# Patient Record
Sex: Male | Born: 1955 | Race: Asian | Hispanic: No | Marital: Single | Smoking: Never smoker
Health system: Southern US, Community
[De-identification: ages and names within clinical notes are randomized; demographics above are authoritative.]

## PROBLEM LIST (undated history)

## (undated) DIAGNOSIS — G4730 Sleep apnea, unspecified: Secondary | ICD-10-CM

## (undated) DIAGNOSIS — R61 Generalized hyperhidrosis: Secondary | ICD-10-CM

## (undated) DIAGNOSIS — K219 Gastro-esophageal reflux disease without esophagitis: Secondary | ICD-10-CM

## (undated) DIAGNOSIS — M1990 Unspecified osteoarthritis, unspecified site: Secondary | ICD-10-CM

## (undated) DIAGNOSIS — T7840XA Allergy, unspecified, initial encounter: Secondary | ICD-10-CM

## (undated) HISTORY — DX: Generalized hyperhidrosis: R61

## (undated) HISTORY — DX: Sleep apnea, unspecified: G47.30

## (undated) HISTORY — DX: Allergy, unspecified, initial encounter: T78.40XA

## (undated) HISTORY — DX: Gastro-esophageal reflux disease without esophagitis: K21.9

---

## 2011-11-16 ENCOUNTER — Ambulatory Visit: Payer: BC Managed Care – PPO

## 2013-03-15 ENCOUNTER — Ambulatory Visit: Payer: BC Managed Care – PPO | Admitting: Physician Assistant

## 2013-03-15 ENCOUNTER — Ambulatory Visit: Payer: BC Managed Care – PPO

## 2013-03-15 VITALS — BP 110/68 | HR 106 | Temp 103.0°F | Resp 17 | Ht 65.0 in | Wt 122.0 lb

## 2013-03-15 DIAGNOSIS — R6889 Other general symptoms and signs: Secondary | ICD-10-CM

## 2013-03-15 DIAGNOSIS — R059 Cough, unspecified: Secondary | ICD-10-CM

## 2013-03-15 DIAGNOSIS — R509 Fever, unspecified: Secondary | ICD-10-CM

## 2013-03-15 DIAGNOSIS — R05 Cough: Secondary | ICD-10-CM

## 2013-03-15 DIAGNOSIS — Z789 Other specified health status: Secondary | ICD-10-CM

## 2013-03-15 LAB — POCT CBC
Granulocyte percent: 83.5 %G — AB (ref 37–80)
HCT, POC: 45.9 % (ref 43.5–53.7)
Hemoglobin: 14.2 g/dL (ref 14.1–18.1)
LYMPH, POC: 0.9 (ref 0.6–3.4)
MCH: 23.4 pg — AB (ref 27–31.2)
MCHC: 30.9 g/dL — AB (ref 31.8–35.4)
MCV: 75.5 fL — AB (ref 80–97)
MID (CBC): 0.6 (ref 0–0.9)
MPV: 9.3 fL (ref 0–99.8)
PLATELET COUNT, POC: 243 10*3/uL (ref 142–424)
POC Granulocyte: 7.4 — AB (ref 2–6.9)
POC LYMPH %: 9.7 % — AB (ref 10–50)
POC MID %: 6.8 % (ref 0–12)
RBC: 6.08 M/uL (ref 4.69–6.13)
RDW, POC: 15.2 %
WBC: 8.9 10*3/uL (ref 4.6–10.2)

## 2013-03-15 LAB — POCT INFLUENZA A/B
INFLUENZA B, POC: NEGATIVE
Influenza A, POC: NEGATIVE

## 2013-03-15 MED ORDER — AZITHROMYCIN 250 MG PO TABS: ORAL_TABLET | ORAL | Status: AC

## 2013-03-15 MED ORDER — ACETAMINOPHEN 325 MG PO TABS: 1000.0000 mg | ORAL_TABLET | Freq: Four times a day (QID) | ORAL | Status: DC | PRN

## 2013-03-15 MED ORDER — HYDROCOD POLST-CHLORPHEN POLST 10-8 MG/5ML PO LQCR: 5.0000 mL | Freq: Two times a day (BID) | ORAL | Status: AC

## 2013-03-15 MED ORDER — GUAIFENESIN ER 1200 MG PO TB12: 1.0000 | ORAL_TABLET | Freq: Two times a day (BID) | ORAL | Status: AC

## 2013-03-15 NOTE — Progress Notes (Signed)
Subjective:    Patient ID: Victor Munoz, male    DOB: 03/18/1955, 58 y.o.   MRN: [id number]  HPI Pt presents to clinic with 6 days h/o feeling poorly.  Pt has flu-like symptoms and he has not been eating in the last 4 days because he is not hungry and when he eats he gets some epigastric pain that radiates into his chest, like a burning pain.  He has not checked his temperature but he has felt hot and cold since then.  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  There is a language barrier.  OTC meds - none Sick contacts - none Works a Theme park manager Flu vaccine - none  Review of Systems  Constitutional: Positive for fever and chills.  HENT: Positive for congestion, rhinorrhea (borwn) and sore throat.   Respiratory: Positive for cough (black) and shortness of breath.   Gastrointestinal: Positive for abdominal pain (epigastric - describes heartburn symptoms). Negative for nausea, vomiting and diarrhea.  Musculoskeletal: Positive for myalgias.  Neurological: Positive for headaches.       Objective:   Physical Exam  Vitals reviewed. Constitutional: He is oriented to person, place, and time. He appears well-developed and well-nourished.  Non-toxic appearance. He appears ill.  Appears to not feel well.    HENT:  Head: Normocephalic and atraumatic.  Right Ear: Hearing, tympanic membrane, external ear and ear canal normal.  Left Ear: Hearing, tympanic membrane, external ear and ear canal normal.  Nose: Mucosal edema (red) and rhinorrhea (clear) present.  Mouth/Throat: Uvula is midline, oropharynx is clear and moist and mucous membranes are normal.  Eyes: Conjunctivae are normal.  Neck: Normal range of motion.  Cardiovascular: Normal rate, regular rhythm and normal heart sounds.   Pulmonary/Chest: Effort normal and breath sounds normal.  Abdominal: Soft. There is tenderness (mild epigstric - when I palpated this region causes burning in his chest).  Lymphadenopathy:    He has no cervical  adenopathy.  Neurological: He is alert and oriented to person, place, and time.  Skin: Skin is warm and dry.  Psychiatric: He has a normal mood and affect. His behavior is normal. Judgment and thought content normal.     Results for orders placed in visit on 03/15/13  POCT INFLUENZA A/B      Result Value Range   Influenza A, POC Negative     Influenza B, POC Negative    POCT CBC      Result Value Range   WBC 8.9  4.6 - 10.2 K/uL   Lymph, poc 0.9  0.6 - 3.4   POC LYMPH PERCENT 9.7 (*) 10 - 50 %L   MID (cbc) 0.6  0 - 0.9   POC MID % 6.8  0 - 12 %M   POC Granulocyte 7.4 (*) 2 - 6.9   Granulocyte percent 83.5 (*) 37 - 80 %G   RBC 6.08  4.69 - 6.13 M/uL   Hemoglobin 14.2  14.1 - 18.1 g/dL   HCT, POC 45.9  43.5 - 53.7 %   MCV 75.5 (*) 80 - 97 fL   MCH, POC 23.4 (*) 27 - 31.2 pg   MCHC 30.9 (*) 31.8 - 35.4 g/dL   RDW, POC 15.2     Platelet Count, POC 243  142 - 424 K/uL   MPV 9.3  0 - 99.8 fL   [facility] reading (PRIMARY) by  Dr. Everlene Farrier.  Increased interstitial marking RML.  Increase streaking over heart border in the  lateral view.     Assessment & Plan:  Fever, unspecified - Plan: acetaminophen (TYLENOL) tablet 975 mg, POCT CBC  Flu-like symptoms - Plan: POCT Influenza A/B, Guaifenesin (MUCINEX MAXIMUM STRENGTH) 1200 MG TB12  Cough - Plan: DG Chest 2 View, azithromycin (ZITHROMAX Z-PAK) 250 MG tablet, chlorpheniramine-HYDROcodone (TUSSIONEX PENNKINETIC ER) 10-8 MG/5ML LQCR  I suspect that the patient has the flu but due to his dirty work environment and continued high fever will cover him for a post-flu PNA.  He will be out of work for 48h and he will RTC in 48h if he is not improved.  Symptomatic flu care was discussed with patient.  I think that he is having a gastritis due to decrease eating from the flu and as he advances diet as tolerated his stomach will improve - if not he will RTC for possible H pylori testing and reflux treatment.  Windell Hummingbird PA-C 03/16/2013 8:05 AM

## 2013-03-15 NOTE — Patient Instructions (Signed)
Push fluids.  Tylenol/motrin prn. Recheck in 2 days if you are not feeling better.

## 2013-03-16 ENCOUNTER — Encounter: Payer: Self-pay | Admitting: Physician Assistant

## 2013-03-16 DIAGNOSIS — Z789 Other specified health status: Secondary | ICD-10-CM | POA: Insufficient documentation

## 2014-05-08 DIAGNOSIS — R61 Generalized hyperhidrosis: Secondary | ICD-10-CM

## 2014-05-08 HISTORY — DX: Generalized hyperhidrosis: R61

## 2014-10-18 ENCOUNTER — Ambulatory Visit: Payer: Worker's Compensation

## 2014-10-18 ENCOUNTER — Ambulatory Visit
Admission: EM | Admit: 2014-10-18 | Discharge: 2014-10-18 | Disposition: A | Discharge status: Home / Self Care | Payer: Worker's Compensation | Attending: Internal Medicine | Admitting: Internal Medicine

## 2014-10-18 DIAGNOSIS — S60222A Contusion of left hand, initial encounter: Secondary | ICD-10-CM | POA: Diagnosis not present

## 2014-10-18 DIAGNOSIS — S63602A Unspecified sprain of left thumb, initial encounter: Secondary | ICD-10-CM | POA: Diagnosis present

## 2014-10-18 DIAGNOSIS — W208XXA Other cause of strike by thrown, projected or falling object, initial encounter: Secondary | ICD-10-CM | POA: Diagnosis not present

## 2014-10-18 MED ORDER — ACETAMINOPHEN 500 MG PO TABS
500.0000 mg | ORAL_TABLET | Freq: Once | ORAL | Status: AC
  Administered 2014-10-18: 500 mg via ORAL

## 2014-10-18 NOTE — ED Provider Notes (Signed)
CSN: [id number]     Arrival date & time 10/18/14  1526 History   First MD Initiated Contact with Patient 10/18/14 1607     Chief Complaint  Patient presents with  . Hand Injury   (Consider location/radiation/quality/duration/timing/severity/associated sxs/prior Treatment) HPI  59 yo M comes with workmate from [employer]. 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 He is counsekle dan appears to agree. He shares that a large air-conditioning unit was being moved and it slipped, pinning Shiraz's hand under it. He indicates the thumb as most uncomfortable but hand is swollen as well  History reviewed. No pertinent past medical history. History reviewed. No pertinent past surgical history. History reviewed. No pertinent family history. Social History  Substance Use Topics  . Smoking status: Never Smoker   . Smokeless tobacco: None  . Alcohol Use: No    Review of Systems Review of 10 systems negative for acute change except as referenced in HPI  Allergies  Review of patient's allergies indicates no known allergies.  Home Medications   Prior to Admission medications   Not on File   BP 130/97 mmHg  Pulse 90  Temp(Src) 98.4 F (36.9 C) (Tympanic)  Resp 16  Ht 5\' 2"  (1.575 m)  Wt 120 lb (54.432 kg)  BMI 21.94 kg/m2  SpO2 99% Physical Exam   Constitutional - Right handed adult male, darkly tanned c/w outside occupation, alert and oriented,denies much distress but identifies thumb as concern Head-atraumatic, normocephalic Eyes- conjunctiva normal, EOMI ,conjugate gaze Nose- no congestion or rhinorrhea Mouth/throat- mucous membranes moist , Neck- supple  CV- regular rate, grossly normal heart sounds,  Resp-no  distress, normal respiratory effort,clear to auscultation bilaterally GI- ,no distention GU- deferred MSK-  normal ROM, all extremities, ambulatory, self-care; left hand with swollen dorsum. Left thumb appears to be either dislocated or significatlty arthritic at MP;  films deny dislocation, warm , fingers FROM , good cap fill; Can approximate thumb to fingertip index but not to 3.4.Marland Kitchen5.6 initially-much more capable with thumb spica on. Neuro- normal speech and language, no gross focal neurological deficit appreciated, no gait instability, Skin-warm,dry ,intact; no rash noted Psych-mood and affect grossly normal; speech and behavior grossly normal ED Course  Procedures (including critical care time) Labs Review Labs Reviewed - No data to display  Imaging Review Dg Hand Complete Left  10/18/2014   CLINICAL DATA:  Patient complaining of left hand being crush by an air conditioner falling on it. His pain is in the thumb region. He also has some swelling there as well.  EXAM: LEFT HAND - COMPLETE 3+ VIEW  COMPARISON:  None.  FINDINGS: No fracture or dislocation.  Mild changes of osteoarthritis with mild asymmetric joint space narrowing of several DIP joints most evident of the fourth finger DIP joint, and mild spurring from the head of the third metacarpal.  Soft tissues are unremarkable.  IMPRESSION: 1. No fracture or dislocation.  No acute finding.   Electronically Signed   By: Lajean Manes M.D.   On: 10/18/2014 16:28     MDM   1. Left thumb sprain, initial encounter   2. Hand contusion, left, initial encounter    Fitted with Universal thumb Spica and he smiled an nodded, apparently  indicating pleasure with the support wrap.  Taught to apply and adjust for comfort to wear except for showers and sleep. He takes Tylenol comfortably and has used 1000 mg TID in the past successfully. May continue Encourage ice paks, frozen peas, tylenol. Elevation and limited activity strongly  encouraged. Wanted to go back to work and this is refused.  Recommend very light duty tomorrow ,off the weekend, back at job site Monday with less than 5 pound lift.  He wanted to go right back to work and this is discouraged- rest for the weekend and will advance as healing progresses. See in one week    Jan Fireman, PA-C 10/19/14 2347

## 2014-10-18 NOTE — ED Notes (Signed)
Apparently trying to slide a mat under an air conditioning unit around 1515 hours and A/C unit fell on left hand. + swelling. Speaks '[place]'. 10/10 pain. Ice pack applied

## 2014-10-18 NOTE — Discharge Instructions (Signed)
You may use Tylenol 500 mg 2 tablets   Two times a day as needed    Please use ice packs  Please do not make your left hand work- it needs to rest for the next week You may move your fingers to exercise them  We will see you next Thursday after 1 pm  October 25, 2014  Thumb Sprain Your exam shows you have a sprained thumb. This means the ligaments around the joint have been torn. Thumb sprains usually take 3-6 weeks to heal. However, severe, unstable sprains may need to be fixed surgically. Sometimes a small piece of bone is pulled off by the ligament. If this is not treated properly, a sprained thumb can lead to a painful, weak joint. Treatment helps reduce pain and shortens the period of disability. The thumb, and often the wrist, must remain splinted for the first 2-4 weeks to protect the joint. Keep your hand elevated and apply ice packs frequently to the injured area (20-30 minutes every 2-3 hours) for the next 2-4 days. This helps reduce swelling and control pain. Pain medicine may also be used for several days. Motion and strengthening exercises may later be prescribed for the joint to return to normal function. Be sure to see your doctor for follow-up because your thumb joint may require further support with splints, bandages or tape. Please see your doctor or go to the emergency room right away if you have increased pain despite proper treatment, or a numb, cold, or pale thumb. Document Released: 04/02/2004 Document Revised: 05/18/2011 Document Reviewed: 02/25/2008 [facility] Patient Information 2015 [place], [place]. This information is not intended to replace advice given to you by your health care provider. Make sure you discuss any questions you have with your health care provider.

## 2014-10-25 ENCOUNTER — Ambulatory Visit
Admission: EM | Admit: 2014-10-25 | Discharge: 2014-10-25 | Disposition: A | Discharge status: Home / Self Care | Payer: Worker's Compensation | Attending: Family Medicine | Admitting: Family Medicine

## 2014-10-25 DIAGNOSIS — S60222D Contusion of left hand, subsequent encounter: Secondary | ICD-10-CM | POA: Diagnosis not present

## 2014-10-25 HISTORY — DX: Unspecified osteoarthritis, unspecified site: M19.90

## 2014-10-25 MED ORDER — IBUPROFEN 600 MG PO TABS: 600.0000 mg | ORAL_TABLET | Freq: Four times a day (QID) | ORAL | Status: DC | PRN

## 2014-10-25 NOTE — ED Notes (Signed)
Seen here at [facility] 10/18/14 after injury left thumb when an air conditioning unit fell on left hand. Continues wearing spica splint and still "hurts a little". Difficulty moving thumb

## 2014-10-25 NOTE — ED Provider Notes (Signed)
CSN: [id number]     Arrival date & time 10/25/14  0909 History   First MD Initiated Contact with Patient 10/18/14 1607     Chief Complaint  Patient presents with  . Follow-up   HPI  Victor Munoz is a pleasant 59 y.o. male here for followup left hand contusion injury.  [place] speaks broken english & exhibits good understanding of my questions.  A large air-conditioning unit was being moved, slipped, pinning patient's hand under it.  He has been using tylenol for pain which helps some & wearing thumb spica.  He is working with weight restriction modifications.  He has continued mild pain & points to left palm contusion & dorsum of thumb.     Review of Systems Gen: Denies any fever, chills, sweats, anorexia, fatigue, weakness, malaise, weight loss, and sleep disorder CV: Denies chest pain, angina, palpitations, syncope, orthopnea, PND, peripheral edema, and claudication. Resp: Denies dyspnea at rest, dyspnea with exercise, cough, sputum, wheezing, coughing up blood, and pleurisy. GI: Denies vomiting blood, jaundice, and fecal incontinence.   Denies dysphagia or odynophagia. Derm: Denies rash, itching, dry skin, hives, moles, warts, or unhealing ulcers.  Psych: Denies depression, anxiety,or confusion. Heme: + bruising, No bleeding nor enlarged lymph nodes.   Allergies  Review of patient's allergies indicates no known allergies.  Home Medications   Prior to Admission medications   Not on File   BP 130/92 mmHg  Pulse 60  Temp(Src) 97 F (36.1 C) (Tympanic)  Resp 16  Ht 5\' 2"  (1.575 m)  Wt 120 lb (54.432 kg)  BMI 21.94 kg/m2  SpO2 100% Physical Exam General:   Alert,  Well-developed, well-nourished, pleasant and cooperative in NAD Head:  Normocephalic and atraumatic. Eyes:  Sclera clear, no icterus.   Conjunctiva pink. Msk:  Symmetrical without gross deformities. Normal posture. Pulses:  Normal pulses noted. Extremities:  Without clubbing or  cyanosis Neurologic:  Alert and  oriented x4;  grossly normal neurologically. Skin:  Warm, intact +left palmar ecchymosis & mild edema MSK-  normal ROM all extremities. +left hand with swollen dorsum. Left thumb with chronic arthritic changes @MP .Left digits ROM limited secondary to swelling, good cap refill; Cannot approximate thumb to fingertips secondary to swelling.  +sensation intact all digits. Psych:  Alert and cooperative. Normal mood and affect.  ED Course  Procedures none  MDM   1. Hand contusion, left, subsequent encounter    Left hand hematoma persists with mild edema & subsequent decreased ROM.  Patient needs referral to orthopedics/hand specialist.  Underlying chronic arthritic changes.  He should continue light duty restrictions per [employer] form.  Plan: 1. Continue spica splint  2. rx as per orders; risks, benefits, potential side effects reviewed with patient 3. Recommend supportive treatment with ice/elevation 4. Referral to Orthopedist within a week   Andria Meuse, NP 10/25/14 1009

## 2016-03-16 ENCOUNTER — Ambulatory Visit (INDEPENDENT_AMBULATORY_CARE_PROVIDER_SITE_OTHER): Payer: 59 | Admitting: Physician Assistant

## 2016-03-16 VITALS — BP 118/72 | HR 77 | Temp 99.2°F | Resp 18 | Ht 62.0 in | Wt 124.0 lb

## 2016-03-16 DIAGNOSIS — K219 Gastro-esophageal reflux disease without esophagitis: Secondary | ICD-10-CM

## 2016-03-16 DIAGNOSIS — J069 Acute upper respiratory infection, unspecified: Secondary | ICD-10-CM | POA: Diagnosis not present

## 2016-03-16 MED ORDER — HYDROCOD POLST-CPM POLST ER 10-8 MG/5ML PO SUER: 5.0000 mL | Freq: Two times a day (BID) | ORAL | 0 refills | Status: DC | PRN

## 2016-03-16 MED ORDER — RANITIDINE HCL 150 MG PO TABS: 150.0000 mg | ORAL_TABLET | Freq: Two times a day (BID) | ORAL | 0 refills | Status: DC

## 2016-03-16 MED ORDER — BENZONATATE 100 MG PO CAPS: 100.0000 mg | ORAL_CAPSULE | Freq: Three times a day (TID) | ORAL | 0 refills | Status: DC | PRN

## 2016-03-16 MED ORDER — FLUTICASONE PROPIONATE 50 MCG/ACT NA SUSP: 2.0000 | Freq: Every day | NASAL | 0 refills | Status: DC

## 2016-03-16 NOTE — Progress Notes (Signed)
MRN: [id number] DOB: 1956/01/04  Subjective:   Victor Munoz is a 61 y.o. male presenting for chief complaint of Sinusitis .  Reports 4 day history of sinus congestion, rhinorrhea, ear fullness, sore throat and dry cough (no hemoptysis).  Has not tried anything for relief. Denies fever, wheezing, shortness of breath and chest tightness, night sweats, chills, nausea, vomiting, abdominal pain and diarrhea. Has not had any sick contact with anyone. No history of seasonal allergies or asthma. Patient has not had flu shot this season. Denies smoking and alcohol use.   Pt also wants to take about heartburn he has been having for the past month. Notes each morning around 3 am wakes up with this acidic regurgitation in his mouth. Has associated belching. He eats dinner around 7pm and lies down around 9pm. Denies spicy food consumption. Mostly eats rice, vegetables, and meat.   Victor Munoz has a current medication list which includes the following prescription(s): ibuprofen, and the following Facility-Administered Medications: acetaminophen. Also has No Known Allergies.  Victor Munoz  has a past medical history of Arthritis. Also  has no past surgical history on file.   Objective:   Vitals: BP 118/72 (BP Location: Right Arm, Patient Position: Sitting, Cuff Size: Small)   Pulse 77   Temp 99.2 F (37.3 C) (Oral)   Resp 18   Ht 5\' 2"  (1.575 m)   Wt 124 lb (56.2 kg)   SpO2 99%   BMI 22.68 kg/m   Physical Exam  Constitutional: He is oriented to person, place, and time. He appears well-developed and well-nourished. No distress.  HENT:  Head: Normocephalic and atraumatic.  Right Ear: External ear and ear canal normal. Tympanic membrane is erythematous.  Left Ear: External ear and ear canal normal. Tympanic membrane is erythematous.  Nose: Mucosal edema and rhinorrhea present. Right sinus exhibits no maxillary sinus tenderness and no frontal sinus tenderness. Left sinus exhibits no maxillary sinus tenderness and no  frontal sinus tenderness.  Mouth/Throat: Uvula is midline and mucous membranes are normal. Posterior oropharyngeal erythema present. No tonsillar exudate.  Eyes: Conjunctivae are normal.  Neck: Normal range of motion.  Cardiovascular: Normal rate, regular rhythm, normal heart sounds and intact distal pulses.   Pulmonary/Chest: Effort normal and breath sounds normal.  Lymphadenopathy:       Head (right side): No submental, no submandibular, no tonsillar, no preauricular, no posterior auricular and no occipital adenopathy present.       Head (left side): No submental, no submandibular, no tonsillar, no preauricular, no posterior auricular and no occipital adenopathy present.    He has no cervical adenopathy.       Right: No supraclavicular adenopathy present.       Left: No supraclavicular adenopathy present.  Neurological: He is alert and oriented to person, place, and time.  Skin: Skin is warm and dry.  Psychiatric: He has a normal mood and affect.  Vitals reviewed.   No results found for this or any previous visit (from the past 24 hour(s)).  Assessment and Plan :  1. Acute upper respiratory infection Likely viral etiology, will treat symptomatically. Pt instructed to return in 7 days if no improvement, return sooner if symptoms worsen.  - fluticasone (FLONASE) 50 MCG/ACT nasal spray; Place 2 sprays into both nostrils daily.  Dispense: 16 g; Refill: 0 - chlorpheniramine-HYDROcodone (TUSSIONEX PENNKINETIC ER) 10-8 MG/5ML SUER; Take 5 mLs by mouth every 12 (twelve) hours as needed for cough.  Dispense: 100 mL; Refill: 0 - benzonatate (TESSALON) 100  MG capsule; Take 1-2 capsules (100-200 mg total) by mouth 3 (three) times daily as needed for cough.  Dispense: 40 capsule; Refill: 0  2. Gastroesophageal reflux disease, esophagitis presence not specified Given educational material on lifestyle management of GERD. Follow up in 4 weeks for reevaluation and for CPE at this time.  - ranitidine  (ZANTAC) 150 MG tablet; Take 1 tablet (150 mg total) by mouth 2 (two) times daily.  Dispense: 60 tablet; Refill: 0  Tenna Delaine, PA-C  [facility] 03/16/2016 10:48 AM

## 2016-03-16 NOTE — Patient Instructions (Addendum)
- We will treat this as a respiratory viral infection.  - I recommend you rest, drink plenty of fluids, eat light meals including soups.  - Use flonase and OTC 12 hour sudafed for sinus congestion. - You may use cough syrup at night for your cough and sore throat, Tessalon pearls during the day. Be aware that cough syrup can definitely make you drowsy and sleepy so do not drive or operate any heavy machinery if it is affecting you during the day.  - You may also use Tylenol or ibuprofen over-the-counter for your sore throat.  - Please let me know if you are not seeing any improvement or get worse in 7 days.  For acid reflux, start taking zantac twice a day for the next four weeks. Limit foods in your diet that may be worsening these symptoms. Also, make sure you wait at least 3 hours before lying down after eating supper at time. If no improvement with treatment after 4 weeks, follow up as we may need to try a different medication.   It also appears that you have not been seen for an annual physical exam. You should make an appointment with me in 4 weeks so we can see how your GERD is doing and we can do basic lab work to make sure all is good!  Thank you for letting me participate in your health and well being.   Food Choices for Gastroesophageal Reflux Disease, Adult When you have gastroesophageal reflux disease (GERD), the foods you eat and your eating habits are very important. Choosing the right foods can help ease your discomfort. What guidelines do I need to follow?  Choose fruits, vegetables, whole grains, and low-fat dairy products.  Choose low-fat meat, fish, and poultry.  Limit fats such as oils, salad dressings, butter, nuts, and avocado.  Keep a food diary. This helps you identify foods that cause symptoms.  Avoid foods that cause symptoms. These may be different for everyone.  Eat small meals often instead of 3 large meals a day.  Eat your meals slowly, in a place where  you are relaxed.  Limit fried foods.  Cook foods using methods other than frying.  Avoid drinking alcohol.  Avoid drinking large amounts of liquids with your meals.  Avoid bending over or lying down until 2-3 hours after eating. What foods are not recommended? These are some foods and drinks that may make your symptoms worse: Vegetables  Tomatoes. Tomato juice. Tomato and spaghetti sauce. Chili peppers. Onion and garlic. Horseradish. Fruits  Oranges, grapefruit, and lemon (fruit and juice). Meats  High-fat meats, fish, and poultry. This includes hot dogs, ribs, ham, sausage, salami, and bacon. Dairy  Whole milk and chocolate milk. Sour cream. Cream. Butter. Ice cream. Cream cheese. Drinks  Coffee and tea. Bubbly (carbonated) drinks or energy drinks. Condiments  Hot sauce. Barbecue sauce. Sweets/Desserts  Chocolate and cocoa. Donuts. Peppermint and spearmint. Fats and Oils  High-fat foods. This includes [place] fries and potato chips. Other  Vinegar. Strong spices. This includes black pepper, white pepper, red pepper, cayenne, curry powder, cloves, ginger, and chili powder. The items listed above may not be a complete list of foods and drinks to avoid. Contact your dietitian for more information.  This information is not intended to replace advice given to you by your health care provider. Make sure you discuss any questions you have with your health care provider. Document Released: 08/25/2011 Document Revised: 08/01/2015 Document Reviewed: 12/28/2012 Elsevier Interactive Patient Education  2017 [employer] Inc[employer]  

## 2016-04-15 ENCOUNTER — Telehealth: Payer: Self-pay

## 2016-04-15 ENCOUNTER — Encounter: Payer: Self-pay | Admitting: Physician Assistant

## 2016-04-15 ENCOUNTER — Ambulatory Visit (INDEPENDENT_AMBULATORY_CARE_PROVIDER_SITE_OTHER): Payer: 59 | Admitting: Physician Assistant

## 2016-04-15 VITALS — BP 128/78 | HR 69 | Temp 97.8°F | Ht 62.0 in | Wt 124.2 lb

## 2016-04-15 DIAGNOSIS — Z0000 Encounter for general adult medical examination without abnormal findings: Secondary | ICD-10-CM

## 2016-04-15 DIAGNOSIS — Z114 Encounter for screening for human immunodeficiency virus [HIV]: Secondary | ICD-10-CM

## 2016-04-15 DIAGNOSIS — Z1159 Encounter for screening for other viral diseases: Secondary | ICD-10-CM | POA: Diagnosis not present

## 2016-04-15 DIAGNOSIS — Z125 Encounter for screening for malignant neoplasm of prostate: Secondary | ICD-10-CM | POA: Diagnosis not present

## 2016-04-15 DIAGNOSIS — K219 Gastro-esophageal reflux disease without esophagitis: Secondary | ICD-10-CM | POA: Diagnosis not present

## 2016-04-15 DIAGNOSIS — Z13220 Encounter for screening for lipoid disorders: Secondary | ICD-10-CM

## 2016-04-15 DIAGNOSIS — Z130 Encounter for screening for diseases of the blood and blood-forming organs and certain disorders involving the immune mechanism: Secondary | ICD-10-CM | POA: Diagnosis not present

## 2016-04-15 DIAGNOSIS — Z23 Encounter for immunization: Secondary | ICD-10-CM

## 2016-04-15 DIAGNOSIS — Z13228 Encounter for screening for other metabolic disorders: Secondary | ICD-10-CM | POA: Diagnosis not present

## 2016-04-15 DIAGNOSIS — Z1211 Encounter for screening for malignant neoplasm of colon: Secondary | ICD-10-CM

## 2016-04-15 MED ORDER — OMEPRAZOLE 20 MG PO CPDR: 20.0000 mg | DELAYED_RELEASE_CAPSULE | Freq: Every day | ORAL | 3 refills | Status: DC

## 2016-04-15 NOTE — Telephone Encounter (Signed)
Pt's nephew would like a call concerning pt's colonoscopy appointment. Please advise Thot at [phone]

## 2016-04-15 NOTE — Progress Notes (Signed)
Victor Munoz  MRN: [id number] DOB: 1955/06/18  Subjective:  Pt is a 61 y.o. male who presents for annual physical exam and follow up on heartburn. He has been taking zantac BID x 4 weeks. Notes the heartburn is better but is still present. He is still having a bitter taste in his mouth in the middle of the night but notes the belching has improved. He is avoiding spicy foods.   Pt is not fasting today. He ate lunch prior to arrival.   Diet: He likes rice, vegetables, pork, and chicken. He does not like fruits. He drinks water and sweet tea.   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   Social: He is from [place]. Moved here in 1980s. He is single. He is not sexually active.   Last annual exam: 2017 Last dental exam: Never Last vision exam: 2018 Last colonoscopy: Never Vaccinations      Tetanus: Cannot recall   Patient Active Problem List   Diagnosis Date Noted  . Language barrier 03/16/2013    Current Outpatient Prescriptions on File Prior to Visit  Medication Sig Dispense Refill  . benzonatate (TESSALON) 100 MG capsule Take 1-2 capsules (100-200 mg total) by mouth 3 (three) times daily as needed for cough. 40 capsule 0  . chlorpheniramine-HYDROcodone (TUSSIONEX PENNKINETIC ER) 10-8 MG/5ML SUER Take 5 mLs by mouth every 12 (twelve) hours as needed for cough. 100 mL 0  . fluticasone (FLONASE) 50 MCG/ACT nasal spray Place 2 sprays into both nostrils daily. 16 g 0  . ranitidine (ZANTAC) 150 MG tablet Take 1 tablet (150 mg total) by mouth 2 (two) times daily. 60 tablet 0   Current Facility-Administered Medications on File Prior to Visit  Medication Dose Route Frequency Provider Last Rate Last Dose  . acetaminophen (TYLENOL) tablet 975 mg  975 mg Oral Q6H PRN Mancel Bale, PA-C        No Known Allergies  Social History   Social History  . Marital status: Single    Spouse name: N/A  . Number of children: N/A  .  Years of education: N/A   Social History Main Topics  . Smoking status: Never Smoker  . Smokeless tobacco: Never Used  . Alcohol use No  . Drug use: No  . Sexual activity: Not Asked   Other Topics Concern  . None   Social History Narrative   From [place].  Been in [place] >10 years.    History reviewed. No pertinent surgical history.  History reviewed. No pertinent family history.  Review of Systems  Constitutional: Negative for activity change, appetite change, chills, diaphoresis, fatigue, fever and unexpected weight change.  HENT: Negative for congestion, dental problem, drooling, ear discharge, ear pain, facial swelling, hearing loss, mouth sores, nosebleeds, postnasal drip, rhinorrhea, sinus pain, sinus pressure, sneezing, sore throat, tinnitus, trouble swallowing and voice change.   Eyes: Negative for photophobia, pain, discharge, redness, itching and visual disturbance.  Respiratory: Negative for apnea, cough, choking, chest tightness, shortness of breath, wheezing and stridor.   Cardiovascular: Negative for chest pain, palpitations and leg swelling.  Gastrointestinal: Negative for abdominal distention, abdominal pain, anal bleeding, blood in stool, constipation, diarrhea, nausea, rectal pain and vomiting.  Endocrine: Negative for cold intolerance, heat intolerance, polydipsia, polyphagia and polyuria.  Genitourinary: Negative for decreased urine volume, difficulty urinating, discharge, dysuria, enuresis, flank pain, frequency, genital sores, hematuria, penile pain, penile swelling, scrotal swelling, testicular pain and  urgency.  Musculoskeletal: Negative for arthralgias, back pain, gait problem, joint swelling, myalgias, neck pain and neck stiffness.  Skin: Negative for color change, pallor, rash and wound.  Allergic/Immunologic: Negative for environmental allergies, food allergies and immunocompromised state.  Neurological: Negative for dizziness, tremors, seizures, syncope,  facial asymmetry, speech difficulty, weakness, light-headedness, numbness and headaches.  Hematological: Negative for adenopathy. Does not bruise/bleed easily.  Psychiatric/Behavioral: Negative for agitation, behavioral problems, confusion, decreased concentration, dysphoric mood, hallucinations, self-injury, sleep disturbance and suicidal ideas. The patient is not nervous/anxious and is not hyperactive.     Objective:  BP 128/78   Pulse 69   Temp 97.8 F (36.6 C) (Oral)   Ht _0  (1.575 m)   Wt 124 lb 3.2 oz (56.3 kg)   SpO2 100%   BMI 22.72 kg/m   Physical Exam  Constitutional: He is oriented to person, place, and time and well-developed, well-nourished, and in no distress.  HENT:  Head: Normocephalic and atraumatic.  Right Ear: Hearing, tympanic membrane, external ear and ear canal normal.  Left Ear: Hearing, tympanic membrane, external ear and ear canal normal.  Nose: Nose normal.  Mouth/Throat: Uvula is midline, oropharynx is clear and moist and mucous membranes are normal. Abnormal dentition. No oropharyngeal exudate.  Eyes: Conjunctivae and EOM are normal. Pupils are equal, round, and reactive to light.  Neck: Trachea normal and normal range of motion.  Cardiovascular: Normal rate, regular rhythm, normal heart sounds and intact distal pulses.   Pulmonary/Chest: Effort normal and breath sounds normal.  Abdominal: Soft. Normal appearance and bowel sounds are normal.  Genitourinary: Prostate is enlarged (mildly enlarged). Prostate is not tender.  Musculoskeletal: Normal range of motion.  Lymphadenopathy:       Head (right side): No submental, no submandibular, no tonsillar, no preauricular, no posterior auricular and no occipital adenopathy present.       Head (left side): No submental, no submandibular, no tonsillar, no preauricular, no posterior auricular and no occipital adenopathy present.    He has no cervical adenopathy.       Right: No supraclavicular adenopathy present.        Left: No supraclavicular adenopathy present.  Neurological: He is alert and oriented to person, place, and time. He has normal sensation, normal strength and normal reflexes. Gait normal.  Skin: Skin is warm and dry.  Psychiatric: Affect normal.  Vitals reviewed.  No exam data present  Assessment and Plan :  Discussed healthy lifestyle, diet, exercise, preventative care, vaccinations, and addressed patient's concerns. Plan for follow up in 2 months. Otherwise, plan for specific conditions below.  1. Annual physical exam Await lab results   2. Need for prophylactic vaccination and inoculation against influenza - Flu Vaccine QUAD 36+ mos IM  3. Screening, anemia, deficiency, iron - CBC with Differential/Platelet  4. Screening for metabolic disorder - WCH85+IDPO  5. Screening for HIV (human immunodeficiency virus) - HIV antibody  6. Need for hepatitis C screening test - Hepatitis C antibody  7. Screening, lipid - Lipid panel  8. Need for TD vaccine - Td vaccine greater than or equal to 7yo preservative free IM  9. Screen for colon cancer - Ambulatory referral to Gastroenterology  10. Screening PSA (prostate specific antigen) - PSA  11. Gastroesophageal reflux disease, esophagitis presence not specified -Discontinue zantac, start prilosec. Instructed to follow up in 8 weeks if no improvement in heartburn.  - omeprazole (PRILOSEC) 20 MG capsule; Take 1 capsule (20 mg total) by mouth daily.  Dispense: 30  capsule; Refill: Oracle PA-C  [facility] 04/15/2016 1:38 PM

## 2016-04-15 NOTE — Patient Instructions (Addendum)
For heartburn, stop taking zantac and start taking omeprazole daily for the next 8 weeks. If you are still having symptoms at 8 weeks, return to our clinic.   I will contact you with your lab results from today once they are resulted.   You will hear from our office for an appointment for your colonoscopy.   Continue your healthy lifestyle. Try to add some fruits to your diet and engage in walking as much as you can.   Thank you for letting me participate in your health and well being.    IF you received an x-ray today, you will receive an invoice from [facility]. Please contact [facility] at [phone] with questions or concerns regarding your invoice.   IF you received labwork today, you will receive an invoice from [place]. Please contact [facility] at [phone] with questions or concerns regarding your invoice.   Our billing staff will not be able to assist you with questions regarding bills from these companies.  You will be contacted with the lab results as soon as they are available. The fastest way to get your results is to activate your My Chart account. Instructions are located on the last page of this paperwork. If you have not heard from [place] regarding the results in 2 weeks, please contact this office.

## 2016-04-15 NOTE — Telephone Encounter (Signed)
Pt's nephew would like a call concerning pt's  lab results. Please advise Thot at [phone]

## 2016-04-16 LAB — CMP14+EGFR
ALBUMIN: 4.4 g/dL (ref 3.5–5.5)
ALT: 6 IU/L (ref 0–44)
AST: 18 IU/L (ref 0–40)
Albumin/Globulin Ratio: 1.5 (ref 1.2–2.2)
Alkaline Phosphatase: 89 IU/L (ref 39–117)
BUN / CREAT RATIO: 10 (ref 9–20)
BUN: 10 mg/dL (ref 6–24)
Bilirubin Total: 0.3 mg/dL (ref 0.0–1.2)
CO2: 23 mmol/L (ref 18–29)
CREATININE: 0.99 mg/dL (ref 0.76–1.27)
Calcium: 9.1 mg/dL (ref 8.7–10.2)
Chloride: 104 mmol/L (ref 96–106)
GFR calc non Af Amer: 84 mL/min/{1.73_m2} (ref >59)
GFR, EST AFRICAN AMERICAN: 97 mL/min/{1.73_m2} (ref >59)
GLUCOSE: 79 mg/dL (ref 65–99)
Globulin, Total: 3 g/dL (ref 1.5–4.5)
Potassium: 4 mmol/L (ref 3.5–5.2)
Sodium: 145 mmol/L — ABNORMAL HIGH (ref 134–144)
TOTAL PROTEIN: 7.4 g/dL (ref 6.0–8.5)

## 2016-04-16 LAB — LIPID PANEL
CHOL/HDL RATIO: 4.7 ratio (ref 0.0–5.0)
Cholesterol, Total: 169 mg/dL (ref 100–199)
HDL: 36 mg/dL — AB (ref >39)
TRIGLYCERIDES: 410 mg/dL — AB (ref 0–149)

## 2016-04-16 LAB — CBC WITH DIFFERENTIAL/PLATELET
BASOS: 2 %
Basophils Absolute: 0.1 10*3/uL (ref 0.0–0.2)
EOS (ABSOLUTE): 0.6 10*3/uL — ABNORMAL HIGH (ref 0.0–0.4)
EOS: 10 %
HEMOGLOBIN: 13.9 g/dL (ref 13.0–17.7)
Hematocrit: 42.2 % (ref 37.5–51.0)
Lymphocytes Absolute: 1.3 10*3/uL (ref 0.7–3.1)
Lymphs: 21 %
MCH: 22.6 pg — AB (ref 26.6–33.0)
MCHC: 32.9 g/dL (ref 31.5–35.7)
MCV: 69 fL — ABNORMAL LOW (ref 79–97)
Monocytes Absolute: 0.2 10*3/uL (ref 0.1–0.9)
Monocytes: 4 %
Neutrophils Absolute: 3.8 10*3/uL (ref 1.4–7.0)
Neutrophils: 62 %
Platelets: 274 10*3/uL (ref 150–379)
RBC: 6.15 x10E6/uL — ABNORMAL HIGH (ref 4.14–5.80)
RDW: 16.1 % — ABNORMAL HIGH (ref 12.3–15.4)
WBC: 6.1 10*3/uL (ref 3.4–10.8)

## 2016-04-16 LAB — IMMATURE CELLS: MYELOCYTES: 1 % — ABNORMAL HIGH (ref 0–0)

## 2016-04-16 LAB — HIV ANTIBODY (ROUTINE TESTING W REFLEX): HIV Screen 4th Generation wRfx: NONREACTIVE

## 2016-04-16 LAB — PSA: Prostate Specific Ag, Serum: 1.2 ng/mL (ref 0.0–4.0)

## 2016-04-16 LAB — HEPATITIS C ANTIBODY: Hep C Virus Ab: <0.1 s/co ratio (ref 0.0–0.9)

## 2016-04-16 NOTE — Telephone Encounter (Signed)
See abnormal results and advise. 

## 2016-04-17 ENCOUNTER — Encounter: Payer: Self-pay | Admitting: Gastroenterology

## 2016-06-04 ENCOUNTER — Ambulatory Visit (AMBULATORY_SURGERY_CENTER): Payer: Self-pay | Admitting: *Deleted

## 2016-06-04 ENCOUNTER — Encounter: Payer: Self-pay | Admitting: Gastroenterology

## 2016-06-04 VITALS — Ht 66.0 in | Wt 122.4 lb

## 2016-06-04 DIAGNOSIS — Z1211 Encounter for screening for malignant neoplasm of colon: Secondary | ICD-10-CM

## 2016-06-04 MED ORDER — NA SULFATE-K SULFATE-MG SULF 17.5-3.13-1.6 GM/177ML PO SOLN: ORAL | 0 refills | Status: DC

## 2016-06-04 NOTE — Progress Notes (Signed)
Pt denies allergies to eggs or soy products. Denies difficulty with sedation or anesthesia. Denies any diet or weight loss medications. Denies use of supplemental oxygen.  Emmi instructions refused by patient and family.

## 2016-06-18 ENCOUNTER — Encounter: Payer: Self-pay | Admitting: Gastroenterology

## 2016-06-18 ENCOUNTER — Ambulatory Visit (AMBULATORY_SURGERY_CENTER): Payer: 59 | Admitting: Gastroenterology

## 2016-06-18 VITALS — BP 116/74 | HR 70 | Temp 98.9°F | Resp 20 | Ht 66.0 in | Wt 122.0 lb

## 2016-06-18 DIAGNOSIS — Z1211 Encounter for screening for malignant neoplasm of colon: Secondary | ICD-10-CM | POA: Diagnosis not present

## 2016-06-18 DIAGNOSIS — D125 Benign neoplasm of sigmoid colon: Secondary | ICD-10-CM | POA: Diagnosis not present

## 2016-06-18 DIAGNOSIS — Z1212 Encounter for screening for malignant neoplasm of rectum: Secondary | ICD-10-CM | POA: Diagnosis not present

## 2016-06-18 DIAGNOSIS — D123 Benign neoplasm of transverse colon: Secondary | ICD-10-CM | POA: Diagnosis not present

## 2016-06-18 DIAGNOSIS — K639 Disease of intestine, unspecified: Secondary | ICD-10-CM

## 2016-06-18 DIAGNOSIS — K635 Polyp of colon: Secondary | ICD-10-CM

## 2016-06-18 MED ORDER — SODIUM CHLORIDE 0.9 % IV SOLN: 500.0000 mL | INTRAVENOUS | Status: DC

## 2016-06-18 NOTE — Patient Instructions (Signed)
Impression/Recommendations:  Polyp handout given to patient. Hemorrhoid handout given to patient.  No Ibuprofen, naproxen, or other NSAID drugs for 2 weeks.   Tylenol only until 07/03/2016.  Repeat colonoscopy recommended for surveillance.  Date to be determined after pathology results reviewed.  YOU HAD AN ENDOSCOPIC PROCEDURE TODAY AT [facility]:   Refer to the procedure report that was given to you for any specific questions about what was found during the examination.  If the procedure report does not answer your questions, please call your gastroenterologist to clarify.  If you requested that your care partner not be given the details of your procedure findings, then the procedure report has been included in a sealed envelope for you to review at your convenience later.  YOU SHOULD EXPECT: Some feelings of bloating in the abdomen. Passage of more gas than usual.  Walking can help get rid of the air that was put into your GI tract during the procedure and reduce the bloating. If you had a lower endoscopy (such as a colonoscopy or flexible sigmoidoscopy) you may notice spotting of blood in your stool or on the toilet paper. If you underwent a bowel prep for your procedure, you may not have a normal bowel movement for a few days.  Please Note:  You might notice some irritation and congestion in your nose or some drainage.  This is from the oxygen used during your procedure.  There is no need for concern and it should clear up in a day or so.  SYMPTOMS TO REPORT IMMEDIATELY:   Following lower endoscopy (colonoscopy or flexible sigmoidoscopy):  Excessive amounts of blood in the stool  Significant tenderness or worsening of abdominal pains  Swelling of the abdomen that is new, acute  Fever of 100F or higher For urgent or emergent issues, a gastroenterologist can be reached at any hour by calling [phone].   DIET:  We do recommend a small meal at first, but then you  may proceed to your regular diet.  Drink plenty of fluids but you should avoid alcoholic beverages for 24 hours.  ACTIVITY:  You should plan to take it easy for the rest of today and you should NOT DRIVE or use heavy machinery until tomorrow (because of the sedation medicines used during the test).    FOLLOW UP: Our staff will call the number listed on your records the next business day following your procedure to check on you and address any questions or concerns that you may have regarding the information given to you following your procedure. If we do not reach you, we will leave a message.  However, if you are feeling well and you are not experiencing any problems, there is no need to return our call.  We will assume that you have returned to your regular daily activities without incident.  If any biopsies were taken you will be contacted by phone or by letter within the next 1-3 weeks.  Please call us at [phone] if you have not heard about the biopsies in 3 weeks.    SIGNATURES/CONFIDENTIALITY: You and/or your care partner have signed paperwork which will be entered into your electronic medical record.  These signatures attest to the fact that that the information above on your After Visit Summary has been reviewed and is understood.  Full responsibility of the confidentiality of this discharge information lies with you and/or your care-partner.

## 2016-06-18 NOTE — Progress Notes (Signed)
Patient left this morning to finish taking prep for procedure. Patient to return after completion. Only one bottle of prep taken yesterday. Instructed to go back home to complete taking the second bottle.

## 2016-06-18 NOTE — Progress Notes (Signed)
Called to room to assist during endoscopic procedure.  Patient ID and intended procedure confirmed with present staff. Received instructions for my participation in the procedure from the performing physician.  

## 2016-06-18 NOTE — Op Note (Signed)
[facility] Patient Name: Victor Munoz Procedure Date: 06/18/2016 2:33 PM MRN: [id number] Endoscopist: Remo Lipps P. Armbruster MD, MD Age: 61 Referring MD:  Date of Birth: November 18, 1955 Gender: Male Account #: [id number] Procedure:                Colonoscopy Indications:              Screening for colorectal malignant neoplasm, This                            is the patient's first colonoscopy Medicines:                Monitored Anesthesia Care Procedure:                Pre-Anesthesia Assessment:                           - Prior to the procedure, a History and Physical                            was performed, and patient medications and                            allergies were reviewed. The patient's tolerance of                            previous anesthesia was also reviewed. The risks                            and benefits of the procedure and the sedation                            options and risks were discussed with the patient.                            All questions were answered, and informed consent                            was obtained. Prior Anticoagulants: The patient has                            taken no previous anticoagulant or antiplatelet                            agents. ASA Grade Assessment: II - A patient with                            mild systemic disease. After reviewing the risks                            and benefits, the patient was deemed in                            satisfactory condition to undergo the procedure.  After obtaining informed consent, the colonoscope                            was passed under direct vision. Throughout the                            procedure, the patient's blood pressure, pulse, and                            oxygen saturations were monitored continuously. The                            Colonoscope was introduced through the anus and                            advanced to the the  terminal ileum, with                            identification of the appendiceal orifice and IC                            valve. The colonoscopy was performed without                            difficulty. The patient tolerated the procedure                            well. The quality of the bowel preparation was                            good. The terminal ileum, ileocecal valve,                            appendiceal orifice, and rectum were photographed. Scope In: 2:47:43 PM Scope Out: 3:14:14 PM Scope Withdrawal Time: 0 hours 20 minutes 26 seconds  Total Procedure Duration: 0 hours 26 minutes 31 seconds  Findings:                 The perianal and digital rectal examinations were                            normal.                           One small nodule versus an everted appendix, was                            found appendiceal orifice. Biopsies were taken with                            a cold forceps for histology.                           Two sessile polyps were found in the transverse  colon. The polyps were 6 to 7 mm in size. These                            polyps were removed with a cold snare. Resection                            and retrieval were complete.                           A 6 mm polyp was found in the sigmoid colon. The                            polyp was sessile. The polyp was removed with a                            cold snare. Resection and retrieval were complete.                            The polyp had persistent oozing for several minutes                            following polypectomy. For hemostasis, one                            hemostatic clip was successfully placed. There was                            no bleeding at the end of the procedure.                           Internal hemorrhoids were found during retroflexion.                           The terminal ileum appeared normal.                           The  exam was otherwise without abnormality. Complications:            No immediate complications. Estimated blood loss:                            Minimal. Estimated Blood Loss:     Estimated blood loss was minimal. Impression:               - Nodule at the appendiceal orifice versus everted                            appendix. Biopsied.                           - Two 6 to 7 mm polyps in the transverse colon,                            removed with a cold snare. Resected and retrieved.                           -  One 6 mm polyp in the sigmoid colon, removed with                            a cold snare. Resected and retrieved. Clip was                            placed for hemostasis.                           - Internal hemorrhoids.                           - The examined portion of the ileum was normal.                           - The examination was otherwise normal. Recommendation:           - Patient has a contact number available for                            emergencies. The signs and symptoms of potential                            delayed complications were discussed with the                            patient. Return to normal activities tomorrow.                            Written discharge instructions were provided to the                            patient.                           - Resume previous diet.                           - Continue present medications.                           - No ibuprofen, naproxen, or other non-steroidal                            anti-inflammatory drugs for 2 weeks after polyp                            removal.                           - Await pathology results.                           - Repeat colonoscopy is recommended for                            surveillance. The colonoscopy date  will be                            determined after pathology results from today's                            exam become available for review. Remo Lipps P.  Armbruster MD, MD 06/18/2016 3:20:10 PM This report has been signed electronically.

## 2016-06-18 NOTE — Progress Notes (Signed)
Report to PACU, RN, vss, BBS= Clear.  

## 2016-06-19 ENCOUNTER — Telehealth: Payer: Self-pay

## 2016-06-19 NOTE — Telephone Encounter (Signed)
  Follow up Call-  Call back number 06/18/2016  Post procedure Call Back phone  # Thot-nephew call him patient do not speak English. [phone]  Permission to leave phone message Yes  Some recent data might be hidden     Patient questions:  Do you have a fever, pain , or abdominal swelling? No. Pain Score  0 *  Have you tolerated food without any problems? Yes.    Have you been able to return to your normal activities? Yes.    Do you have any questions about your discharge instructions: Diet   No. Medications  No. Follow up visit  No.  Do you have questions or concerns about your Care? No.  Actions: * If pain score is 4 or above: No action needed, pain <4.

## 2016-06-22 ENCOUNTER — Telehealth: Payer: Self-pay | Admitting: Gastroenterology

## 2016-06-22 NOTE — Telephone Encounter (Signed)
He had a colonoscopy last week, of note he had a left sided polyp removed with cold snare but due to persistent oozing one hemostasis clip was placed with good hemostasis. He also had hemorrhoids noted on his exam. Is his bleeding just a small amount? If so, suspect due to hemorrhoids. He can take a daily fiber supplement. If having significant bleeding let me know but think the chance of that this far out from his procedure would be quite unlikely.  He may want to take it easy for a day if he has a very strenous job to see if he feels a bit better. Thanks

## 2016-06-22 NOTE — Telephone Encounter (Signed)
Patient's nephew called, patient states that he is still having abdominal pain, small amount of rectal bleeding. 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 They were wondering if he needs to have any restrictions.

## 2016-06-23 NOTE — Telephone Encounter (Signed)
Left message for Thot, patient's nephew, asked him to call back to let us know how patient is doing. If he needs to take it easy for the next few days and needs a note, will be glad to provide that. Also recommended a daily fiber supplement that can be purchased OTC.

## 2016-06-26 ENCOUNTER — Telehealth: Payer: Self-pay

## 2016-06-26 ENCOUNTER — Other Ambulatory Visit: Payer: Self-pay

## 2016-06-26 DIAGNOSIS — R933 Abnormal findings on diagnostic imaging of other parts of digestive tract: Secondary | ICD-10-CM

## 2016-06-26 NOTE — Telephone Encounter (Signed)
Great - thanks

## 2016-06-26 NOTE — Telephone Encounter (Signed)
Spoke to our interpreter service and they do not have one available for patient's language. I did call patient's nephew and lvm for him to call back to obtain results. Patient is currently scheduled for CT abdomen/pelvis on 5/2 at 9:00 am, arrive at 8:45 am [facility]. Will need to drink contrast one bottle at 7:00 am and the other at 8:00 am. Patient will need to come pick up prep directions and contrast at our office. Hospital aware he will need an interpreter.

## 2016-07-08 ENCOUNTER — Ambulatory Visit (HOSPITAL_COMMUNITY)
Admission: RE | Admit: 2016-07-08 | Discharge: 2016-07-08 | Disposition: A | Discharge status: Home / Self Care | Payer: 59 | Source: Ambulatory Visit | Attending: Gastroenterology | Admitting: Gastroenterology

## 2016-07-08 DIAGNOSIS — I700 Atherosclerosis of aorta: Secondary | ICD-10-CM | POA: Insufficient documentation

## 2016-07-08 DIAGNOSIS — R933 Abnormal findings on diagnostic imaging of other parts of digestive tract: Secondary | ICD-10-CM | POA: Diagnosis present

## 2016-07-08 MED ORDER — IOPAMIDOL (ISOVUE-300) INJECTION 61%
INTRAVENOUS | Status: AC
  Administered 2016-07-08: 100 mL
  Filled 2016-07-08: qty 100

## 2016-12-11 ENCOUNTER — Encounter (HOSPITAL_COMMUNITY): Payer: Self-pay | Admitting: *Deleted

## 2016-12-11 ENCOUNTER — Ambulatory Visit (HOSPITAL_COMMUNITY)
Admission: EM | Admit: 2016-12-11 | Discharge: 2016-12-11 | Disposition: A | Discharge status: Home / Self Care | Payer: BLUE CROSS/BLUE SHIELD | Attending: Internal Medicine | Admitting: Internal Medicine

## 2016-12-11 DIAGNOSIS — S61011A Laceration without foreign body of right thumb without damage to nail, initial encounter: Secondary | ICD-10-CM | POA: Diagnosis not present

## 2016-12-11 MED ORDER — CEPHALEXIN 500 MG PO CAPS: 500.0000 mg | ORAL_CAPSULE | Freq: Three times a day (TID) | ORAL | 0 refills | Status: DC

## 2016-12-11 NOTE — Discharge Instructions (Signed)
Keep the area dry for the next couple days. If the Steri-Strips fall off reapply. Watch for signs of infection. Redness, swelling, pus, increased pain. If you see this return immediately.Start taking the antibiotics as soon as possible. This wound should heal  without further treatment.

## 2016-12-11 NOTE — ED Provider Notes (Signed)
[place]    CSN: [id number] Arrival date & time: 12/11/16  1818     History   Chief Complaint Chief Complaint  Patient presents with  . Finger Injury    HPI Victor Munoz is a 61 y.o. male.   61 year old male was using a hand truck today when his thumb slipped and struck a sharp edge of the metallic sheen. This produced a slim laceration to the whole of the right thumb entering tangentially. The edges are close together. Well approximated. Currently no bleeding.      Past Medical History:  Diagnosis Date  . Allergy    seasonal  . Arthritis   . GERD (gastroesophageal reflux disease)   . Night sweats 05/08/2014  . Sleep apnea    not diagnosed, but patient wakes up int he middle of the night SOB    Patient Active Problem List   Diagnosis Date Noted  . Language barrier 03/16/2013    History reviewed. No pertinent surgical history.     Home Medications    Prior to Admission medications   Medication Sig Start Date End Date Taking? Authorizing Provider  cephALEXin (KEFLEX) 500 MG capsule Take 1 capsule (500 mg total) by mouth 3 (three) times daily. 12/11/16   Janne Napoleon, NP  omeprazole (PRILOSEC) 20 MG capsule Take 1 capsule (20 mg total) by mouth daily. 04/15/16   Leonie Douglas, PA-C    Family History Family History  Problem Relation Age of Onset  . Colon cancer Neg Hx     Social History Social History  Substance Use Topics  . Smoking status: Never Smoker  . Smokeless tobacco: Never Used  . Alcohol use No     Allergies   Patient has no known allergies.   Review of Systems Review of Systems  Constitutional: Negative.   HENT: Negative.   Musculoskeletal: Negative.   Skin: Positive for wound.  Hematological: Negative.   All other systems reviewed and are negative.    Physical Exam Triage Vital Signs ED Triage Vitals [12/11/16 1910]  Enc Vitals Group     BP (!) 141/78     Pulse Rate (!) 59     Resp 16     Temp 98.3 F (36.8  C)     Temp Source Oral     SpO2 100 %     Weight      Height      Head Circumference      Peak Flow      Pain Score 7     Pain Loc      Pain Edu?      Excl. in [place]?    No data found.   Updated Vital Signs BP (!) 141/78 (BP Location: Left Arm)   Pulse (!) 59   Temp 98.3 F (36.8 C) (Oral)   Resp 16   SpO2 100%   Visual Acuity Right Eye Distance:   Left Eye Distance:   Bilateral Distance:    Right Eye Near:   Left Eye Near:    Bilateral Near:     Physical Exam  Constitutional: He is oriented to person, place, and time. He appears well-developed and well-nourished. No distress.  Eyes: EOM are normal.  Neck: Neck supple.  Cardiovascular: Normal rate.   Pulmonary/Chest: Effort normal. No respiratory distress.  Musculoskeletal: He exhibits no edema.  Neurological: He is alert and oriented to person, place, and time. He exhibits normal muscle tone.  Skin: Skin is warm and dry.  Laceration approximately 2 cm in length. It is at an angle at the pulp of the right thumb. Edges well approximated but can be separated to about 2-3 mm manually.  Psychiatric: He has a normal mood and affect.  Nursing note and vitals reviewed.    UC Treatments / Results  Labs (all labs ordered are listed, but only abnormal results are displayed) Labs Reviewed - No data to display  EKG  EKG Interpretation None       Radiology No results found.  Procedures Procedures (including critical care time)  Medications Ordered in UC Medications - No data to display   Initial Impression / Assessment and Plan / UC Course  I have reviewed the triage vital signs and the nursing notes.  Pertinent labs & imaging results that were available during my care of the patient were reviewed by me and considered in my medical decision making (see chart for details).    Keep the area dry for the next couple days. If the Steri-Strips fall off reapply. Watch for signs of infection. Redness, swelling,  pus, increased pain. If you see this return immediately.Start taking the antibiotics as soon as possible. This wound should heal  without further treatment.  The wound holds together when the edges are released. Procedure note: Wound was irrigated under spray force with saline and soap. Then cleaned over with Betadine and closed loosely with Steri-Strips. A dressing is then applied.  Final Clinical Impressions(s) / UC Diagnoses   Final diagnoses:  Laceration of right thumb without foreign body without damage to nail, initial encounter    New Prescriptions New Prescriptions   CEPHALEXIN (KEFLEX) 500 MG CAPSULE    Take 1 capsule (500 mg total) by mouth 3 (three) times daily.     Controlled Substance Prescriptions [place] Controlled Substance Registry consulted? Not Applicable   Janne Napoleon, NP 12/11/16 2014

## 2016-12-11 NOTE — ED Triage Notes (Addendum)
Patient states today he slammed his right thumb. Bleeding controlled. Small well approximated laceration noted to anterior distal end of finger. Sensation intact. Tetanus up to date.

## 2018-12-30 ENCOUNTER — Encounter: Payer: Self-pay | Admitting: Family Medicine

## 2018-12-30 ENCOUNTER — Ambulatory Visit (INDEPENDENT_AMBULATORY_CARE_PROVIDER_SITE_OTHER): Payer: BC Managed Care – PPO

## 2018-12-30 ENCOUNTER — Other Ambulatory Visit: Payer: Self-pay

## 2018-12-30 ENCOUNTER — Ambulatory Visit (INDEPENDENT_AMBULATORY_CARE_PROVIDER_SITE_OTHER): Payer: BC Managed Care – PPO | Admitting: Family Medicine

## 2018-12-30 VITALS — BP 140/90 | HR 95 | Temp 99.0°F | Ht 66.0 in | Wt 111.0 lb

## 2018-12-30 DIAGNOSIS — R519 Headache, unspecified: Secondary | ICD-10-CM

## 2018-12-30 DIAGNOSIS — K5900 Constipation, unspecified: Secondary | ICD-10-CM

## 2018-12-30 DIAGNOSIS — R1013 Epigastric pain: Secondary | ICD-10-CM | POA: Diagnosis not present

## 2018-12-30 LAB — POCT CBC
Granulocyte percent: 72.8 %G (ref 37–80)
HCT, POC: 46 % — AB (ref 29–41)
Hemoglobin: 14.6 g/dL (ref 11–14.6)
Lymph, poc: 0.8 (ref 0.6–3.4)
MCH, POC: 23.2 pg — AB (ref 27–31.2)
MCHC: 31.7 g/dL — AB (ref 31.8–35.4)
MCV: 73.5 fL — AB (ref 76–111)
MID (cbc): 0.4 (ref 0–0.9)
MPV: 8.7 fL (ref 0–99.8)
POC Granulocyte: 3.2 (ref 2–6.9)
POC LYMPH PERCENT: 18.3 %L (ref 10–50)
POC MID %: 8.9 %M (ref 0–12)
Platelet Count, POC: 140 10*3/uL — AB (ref 142–424)
RBC: 6.26 M/uL — AB (ref 4.69–6.13)
RDW, POC: 14.7 %
WBC: 4.4 10*3/uL — AB (ref 4.6–10.2)

## 2018-12-30 MED ORDER — POLYETHYLENE GLYCOL 3350 17 GM/SCOOP PO POWD: 17.0000 g | Freq: Every day | ORAL | 1 refills | Status: DC

## 2018-12-30 MED ORDER — OMEPRAZOLE 20 MG PO CPDR: 20.0000 mg | DELAYED_RELEASE_CAPSULE | Freq: Every day | ORAL | 3 refills | Status: DC

## 2018-12-30 NOTE — Patient Instructions (Signed)
° ° ° °  If you have lab work done today you will be contacted with your lab results within the next 2 weeks.  If you have not heard from us then please contact us. The fastest way to get your results is to register for My Chart. ° ° °IF you received an x-ray today, you will receive an invoice from [facility]. Please contact [facility] at [phone] with questions or concerns regarding your invoice.  ° °IF you received labwork today, you will receive an invoice from [facility]. Please contact [facility] at [phone] with questions or concerns regarding your invoice.  ° °Our billing staff will not be able to assist you with questions regarding bills from these companies. ° °You will be contacted with the lab results as soon as they are available. The fastest way to get your results is to activate your My Chart account. Instructions are located on the last page of this paperwork. If you have not heard from us regarding the results in 2 weeks, please contact this office. °  ° ° ° °

## 2018-12-30 NOTE — Progress Notes (Signed)
10/23/202010:24 AM  Victor Munoz 08/23/1955, 63 y.o., male [id number]  Chief Complaint  Patient presents with  . Pain    in the stomach and head for he pas 3 days, taking tylenol for the pain. Had polyp removal from intestine. Having constipation, has not eaten in 3 days    HPI:   Patient is a 63 y.o. male with past medical history significant for colonic polyps who presents today for abd pain and headache  Interpreter present  Headaches - started having for past 3 days, yesterday pain intensified, posterior, associated with neck pain and dizziness when walking, no vision changes, no hearing changes, no tinnitus, having nausea and reflux No previous histories of headaches No fever or chills but having night sweats No malaise, CP, cough, SOB Has had sore throat for past 2 days, thought it was related to recent use of cleaning products  Abd pain - epigastric, burning pain for past 3 days, radiates to his back, reflux, nausea, no vomiting, not passing gas Pain is constant. Last BM 3 days ago, reports it was black tarry, has not seen any bright red blood Reports previous issues with constipation Denies any ulcers Denies any NSAID use Nonsmoker  Last OV 2018 with Timmothy Euler, Pa-c for CPE - normal HCM labs x microcytosis and presence of myelocytes detected and TG 410  Colonoscopy in 2018 - adenomatous polyps, repeat in 3 years, abnormal appendix on colonoscopy - normal CT    Depression screen [facility] 2/9 12/30/2018 04/15/2016  Decreased Interest 0 0  Down, Depressed, Hopeless 0 0  PHQ - 2 Score 0 0    Fall Risk  12/30/2018 04/15/2016  Falls in the past year? 0 No  Number falls in past yr: 0 -  Injury with Fall? 0 -     No Known Allergies  Prior to Admission medications   Not on File    Past Medical History:  Diagnosis Date  . Allergy    seasonal  . Arthritis   . GERD (gastroesophageal reflux disease)   . Night sweats 05/08/2014  . Sleep apnea    not diagnosed, but patient  wakes up int he middle of the night SOB    No past surgical history on file.  Social History   Tobacco Use  . Smoking status: Never Smoker  . Smokeless tobacco: Never Used  Substance Use Topics  . Alcohol use: No    Family History  Problem Relation Age of Onset  . Colon cancer Neg Hx     ROS Per hpi  OBJECTIVE:  Today's Vitals   12/30/18 1021 12/30/18 1022  BP: (!) 143/97 140/90  Pulse: 95   Temp: 99 F (37.2 C)   SpO2: 97%   Weight: 111 lb (50.3 kg)   Height: 5\' 6"  (1.676 m)    Body mass index is 17.92 kg/m.   Physical Exam Vitals signs and nursing note reviewed.  Constitutional:      Appearance: He is well-developed.  HENT:     Head: Normocephalic and atraumatic.     Comments: Mild TTP along right temple Bilateral TA with palpable pulses, non tender, not indurated    Right Ear: Hearing, tympanic membrane, ear canal and external ear normal.     Left Ear: Hearing, tympanic membrane, ear canal and external ear normal.     Mouth/Throat:     Pharynx: No oropharyngeal exudate.  Eyes:     Conjunctiva/sclera: Conjunctivae normal.     Pupils: Pupils are equal, round, and  reactive to light.  Neck:     Musculoskeletal: Normal range of motion and neck supple. No muscular tenderness.     Thyroid: No thyromegaly.  Cardiovascular:     Rate and Rhythm: Normal rate and regular rhythm.     Heart sounds: Normal heart sounds. No murmur. No friction rub. No gallop.   Pulmonary:     Effort: Pulmonary effort is normal.     Breath sounds: Normal breath sounds. No wheezing, rhonchi or rales.  Abdominal:     General: Bowel sounds are normal. There is no distension.     Palpations: Abdomen is soft. There is no mass.     Tenderness: There is abdominal tenderness in the epigastric area and left upper quadrant. There is no guarding or rebound.  Musculoskeletal: Normal range of motion.  Lymphadenopathy:     Cervical: No cervical adenopathy.  Skin:    General: Skin is warm and  dry.  Neurological:     Mental Status: He is alert and oriented to person, place, and time.     Cranial Nerves: No cranial nerve deficit.     Coordination: Coordination normal.     Gait: Gait normal.     Deep Tendon Reflexes: Reflexes are normal and symmetric.  Psychiatric:        Mood and Affect: Mood normal.     Results for orders placed or performed in visit on 12/30/18 (from the past 24 hour(s))  POCT CBC     Status: Abnormal   Collection Time: 12/30/18 11:01 AM  Result Value Ref Range   WBC 4.4 (A) 4.6 - 10.2 K/uL   Lymph, poc 0.8 0.6 - 3.4   POC LYMPH PERCENT 18.3 10 - 50 %L   MID (cbc) 0.4 0 - 0.9   POC MID % 8.9 0 - 12 %M   POC Granulocyte 3.2 2 - 6.9   Granulocyte percent 72.8 37 - 80 %G   RBC 6.26 (A) 4.69 - 6.13 M/uL   Hemoglobin 14.6 11 - 14.6 g/dL   HCT, POC 46.0 (A) 29 - 41 %   MCV 73.5 (A) 76 - 111 fL   MCH, POC 23.2 (A) 27 - 31.2 pg   MCHC 31.7 (A) 31.8 - 35.4 g/dL   RDW, POC 14.7 %   Platelet Count, POC 140 (A) 142 - 424 K/uL   MPV 8.7 0 - 99.8 fL    Dg Abd 2 Views  Result Date: 12/30/2018 CLINICAL DATA:  Constipation and nausea. EXAM: ABDOMEN - 2 VIEW COMPARISON:  07/08/2016 CT FINDINGS: Nondistended gas-filled colon and small bowel noted. A small to moderate amount of stool within colon noted. No dilated bowel loops or pneumoperitoneum. No suspicious calcifications are identified. No acute or suspicious bony abnormalities are present. 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 IMPRESSION: Nonspecific nonobstructive bowel gas pattern. Small to moderate amount of colonic stool. Electronically Signed   By: Margarette Canada M.D.   On: 12/30/2018 11:10     ASSESSMENT and PLAN  1. Acute nonintractable headache, unspecified headache type AF, VSS, history and exam favor viral. Sed rate given age and new presentation of headaches - normal neuro exam, discussed supportive measures, consider imagining if not resolved. Strict ER precautions given. -  Sedimentation Rate - POCT CBC  2. Abdominal pain, epigastric Favor viral induced gastritis, labs to r/o GB/pancreas causes. Start PPI empirically. Discussed dietary modifications. Re-eval at next OV - Comprehensive metabolic panel - Lipase - Amylase  3. Constipation, unspecified  constipation type No signs of obstruction on xray. Discussed miralax. Strict ER precautions given. - DG Abd 2 Views; Future  Other orders - omeprazole (PRILOSEC) 20 MG capsule; Take 1 capsule (20 mg total) by mouth daily. - polyethylene glycol powder (GLYCOLAX/MIRALAX) 17 GM/SCOOP powder; Take 17 g by mouth daily.  Return in about 1 week (around 01/06/2019).    Rutherford Guys, MD Primary Care at [place], [place] [address] Ph.  [phone] Fax [phone]

## 2018-12-31 LAB — COMPREHENSIVE METABOLIC PANEL
ALT: 6 IU/L (ref 0–44)
AST: 24 IU/L (ref 0–40)
Albumin/Globulin Ratio: 1.6 (ref 1.2–2.2)
Albumin: 4.6 g/dL (ref 3.8–4.8)
Alkaline Phosphatase: 61 IU/L (ref 39–117)
BUN/Creatinine Ratio: 19 (ref 10–24)
BUN: 19 mg/dL (ref 8–27)
Bilirubin Total: 0.5 mg/dL (ref 0.0–1.2)
CO2: 22 mmol/L (ref 20–29)
Calcium: 9.2 mg/dL (ref 8.6–10.2)
Chloride: 101 mmol/L (ref 96–106)
Creatinine, Ser: 1.01 mg/dL (ref 0.76–1.27)
GFR calc Af Amer: 92 mL/min/{1.73_m2} (ref >59)
GFR calc non Af Amer: 80 mL/min/{1.73_m2} (ref >59)
Globulin, Total: 2.8 g/dL (ref 1.5–4.5)
Glucose: 79 mg/dL (ref 65–99)
Potassium: 3.7 mmol/L (ref 3.5–5.2)
Sodium: 139 mmol/L (ref 134–144)
Total Protein: 7.4 g/dL (ref 6.0–8.5)

## 2018-12-31 LAB — SEDIMENTATION RATE: Sed Rate: 12 mm/hr (ref 0–30)

## 2018-12-31 LAB — AMYLASE: Amylase: 101 U/L (ref 31–110)

## 2018-12-31 LAB — LIPASE: Lipase: 27 U/L (ref 13–78)

## 2019-01-06 ENCOUNTER — Other Ambulatory Visit: Payer: Self-pay

## 2019-01-06 ENCOUNTER — Encounter: Payer: Self-pay | Admitting: Family Medicine

## 2019-01-06 ENCOUNTER — Telehealth (INDEPENDENT_AMBULATORY_CARE_PROVIDER_SITE_OTHER): Payer: BC Managed Care – PPO | Admitting: Family Medicine

## 2019-01-06 DIAGNOSIS — Z20822 Contact with and (suspected) exposure to covid-19: Secondary | ICD-10-CM

## 2019-01-06 DIAGNOSIS — Z20828 Contact with and (suspected) exposure to other viral communicable diseases: Secondary | ICD-10-CM

## 2019-01-06 NOTE — Progress Notes (Signed)
   Virtual Visit Note  I connected with patient on 01/06/19 at 1013am by phone and verified that I am speaking with the correct person using two identifiers. Victor Munoz is currently located at car and patient and interpreter is currently with them during visit. The provider, Rutherford Guys, MD is located in their office at time of visit.  I discussed the limitations, risks, security and privacy concerns of performing an evaluation and management service by telephone and the availability of in person appointments. I also discussed with the patient that there may be a patient responsible charge related to this service. The patient expressed understanding and agreed to proceed.   CC: covid exposure  HPI ? 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 Tylenol [Acetaminophen]     Dizziness    Prior to Admission medications   Medication Sig Start Date End Date Taking? Authorizing Provider  omeprazole (PRILOSEC) 20 MG capsule Take 1 capsule (20 mg total) by mouth daily. 12/30/18  Yes Rutherford Guys, MD  polyethylene glycol powder (GLYCOLAX/MIRALAX) 17 GM/SCOOP powder Take 17 g by mouth daily. 12/30/18  Yes Rutherford Guys, MD    Past Medical History:  Diagnosis Date  . Allergy    seasonal  . Arthritis   . GERD (gastroesophageal reflux disease)   . Night sweats 05/08/2014  . Sleep apnea    not diagnosed, but patient wakes up int he middle of the night SOB    No past surgical history on file.  Social History   Tobacco Use  . Smoking status: Never Smoker  . Smokeless tobacco:  Never Used  Substance Use Topics  . Alcohol use: No    Family History  Problem Relation Age of Onset  . Colon cancer Neg Hx     ROS Per hpi  Objective  Vitals as reported by the patient: none   ASSESSMENT and PLAN  1. Exposure to COVID-19 virus Patient evaluated, sent for testing and home with instructions for home care and Quarantine. Instructed to seek further care if symptoms worsen.  - Novel Coronavirus, NAA ([facility])  FOLLOW-UP: 3-5 days, telemedicine   The above assessment and management plan was discussed with the patient. The patient verbalized understanding of and has agreed to the management plan. Patient is aware to call the clinic if symptoms persist or worsen. Patient is aware when to return to the clinic for a follow-up visit. Patient educated on when it is appropriate to go to the emergency department.    I provided 15 minutes of non-face-to-face time during this encounter.  Rutherford Guys, MD Primary Care at [place], [place] [address] Ph.  [phone] Fax [phone]

## 2019-01-06 NOTE — Patient Instructions (Addendum)
     If you have lab work done today you will be contacted with your lab results within the next 2 weeks.  If you have not heard from [place] then please contact us. The fastest way to get your results is to register for My Chart.   IF you received an x-ray today, you will receive an invoice from [facility]. Please contact [facility] at [phone] with questions or concerns regarding your invoice.   IF you received labwork today, you will receive an invoice from [facility]. Please contact [facility] at [phone] with questions or concerns regarding your invoice.   Our billing staff will not be able to assist you with questions regarding bills from these companies.  You will be contacted with the lab results as soon as they are available. The fastest way to get your results is to activate your My Chart account. Instructions are located on the last page of this paperwork. If you have not heard from [place] regarding the results in 2 weeks, please contact this office.     [place], [place], [place]  (entrance off [address])  M-F from 8am - 3:30pm

## 2019-01-06 NOTE — Progress Notes (Signed)
Medical conditions 1 week f/u   Pt still having Headaches- Comes and Goes still going on since last visit.   Currently has a mild headache.  Pt phone Number - [phone] Translator's phone - [phone] I have conference called these two numbers. They are in their own car

## 2019-01-09 LAB — NOVEL CORONAVIRUS, NAA: SARS-CoV-2, NAA: NOT DETECTED

## 2019-01-10 ENCOUNTER — Encounter: Payer: BC Managed Care – PPO | Admitting: Family Medicine

## 2019-01-10 NOTE — Progress Notes (Signed)
Unable to establish communication Visit was not conducted This encounter was created in error - please disregard.

## 2019-01-16 ENCOUNTER — Other Ambulatory Visit: Payer: Self-pay

## 2019-01-16 ENCOUNTER — Ambulatory Visit (INDEPENDENT_AMBULATORY_CARE_PROVIDER_SITE_OTHER): Payer: BC Managed Care – PPO | Admitting: Family Medicine

## 2019-01-16 ENCOUNTER — Encounter: Payer: Self-pay | Admitting: Family Medicine

## 2019-01-16 ENCOUNTER — Telehealth: Payer: Self-pay | Admitting: Family Medicine

## 2019-01-16 VITALS — BP 122/86 | HR 87 | Temp 98.5°F | Ht 63.0 in | Wt 115.4 lb

## 2019-01-16 DIAGNOSIS — K219 Gastro-esophageal reflux disease without esophagitis: Secondary | ICD-10-CM

## 2019-01-16 DIAGNOSIS — K5900 Constipation, unspecified: Secondary | ICD-10-CM

## 2019-01-16 DIAGNOSIS — R519 Headache, unspecified: Secondary | ICD-10-CM

## 2019-01-16 DIAGNOSIS — Z789 Other specified health status: Secondary | ICD-10-CM | POA: Diagnosis not present

## 2019-01-16 MED ORDER — PANTOPRAZOLE SODIUM 20 MG PO TBEC: 20.0000 mg | DELAYED_RELEASE_TABLET | Freq: Two times a day (BID) | ORAL | 2 refills | Status: DC

## 2019-01-16 NOTE — Telephone Encounter (Signed)
Pt was seen today with [place] and dropped off Disability Paperwork. Put paperwork in Provider Box ar nurses station   FR collected 15.00 fee for [facility]

## 2019-01-16 NOTE — Patient Instructions (Signed)
° ° ° °  If you have lab work done today you will be contacted with your lab results within the next 2 weeks.  If you have not heard from us then please contact us. The fastest way to get your results is to register for My Chart. ° ° °IF you received an x-ray today, you will receive an invoice from [facility]. Please contact [facility] at [phone] with questions or concerns regarding your invoice.  ° °IF you received labwork today, you will receive an invoice from [facility]. Please contact [facility] at [phone] with questions or concerns regarding your invoice.  ° °Our billing staff will not be able to assist you with questions regarding bills from these companies. ° °You will be contacted with the lab results as soon as they are available. The fastest way to get your results is to activate your My Chart account. Instructions are located on the last page of this paperwork. If you have not heard from us regarding the results in 2 weeks, please contact this office. °  ° ° ° °

## 2019-01-16 NOTE — Progress Notes (Signed)
11/9/202010:22 AM  Victor Munoz 06/11/1955, 63 y.o., male [id number]  Chief Complaint  Patient presents with  . Abdominal Pain    still ongoing (3 months) am is soarness turn to HA   . Headache  . Medication Problem    some itching after Omeprazole    HPI:   Patient is a 63 y.o. male who presents today for abd pain and headaches  Started about 3 weeks, seen for first time oct 23, quarantine since then covid neg, positive coworker, tested on oct 30th, has completed quarantine 14 days, needs forms completed Amylase and lipase neg Normal CBC and CMP Normal sed rate axr -non acute Started on PPI  Interpreter present Continues to have heartburn in the morning when he wakes, has been taking omeprazole once a day However omeprazole making him itchy but no rash Eating and PPI has been helping No black tarry stools, no vomiting  miralax daily, constipation resolved  Morning reflux then followed by headaches and dizziness Otherwise he has no headaches/dizziness No vision changes, no speech changes No focal weakness    Depression screen [facility] 2/9 01/16/2019 12/30/2018 04/15/2016  Decreased Interest 0 0 0  Down, Depressed, Hopeless 0 0 0  PHQ - 2 Score 0 0 0    Fall Risk  01/16/2019 12/30/2018 04/15/2016  Falls in the past year? 0 0 No  Number falls in past yr: 0 0 -  Injury with Fall? 0 0 -  Follow up Falls evaluation completed - -     Allergies  Allergen Reactions  . Tylenol [Acetaminophen]     Dizziness    Prior to Admission medications   Medication Sig Start Date End Date Taking? Authorizing Provider  omeprazole (PRILOSEC) 20 MG capsule Take 1 capsule (20 mg total) by mouth daily. 12/30/18  Yes Rutherford Guys, MD  polyethylene glycol powder (GLYCOLAX/MIRALAX) 17 GM/SCOOP powder Take 17 g by mouth daily. 12/30/18  Yes Rutherford Guys, MD    Past Medical History:  Diagnosis Date  . Allergy    seasonal  . Arthritis   . GERD (gastroesophageal reflux disease)   .  Night sweats 05/08/2014  . Sleep apnea    not diagnosed, but patient wakes up int he middle of the night SOB    No past surgical history on file.  Social History   Tobacco Use  . Smoking status: Never Smoker  . Smokeless tobacco: Never Used  Substance Use Topics  . Alcohol use: No    Family History  Problem Relation Age of Onset  . Colon cancer Neg Hx     ROS Per hpi  OBJECTIVE:  Today's Vitals   01/16/19 0952 01/16/19 1005  BP: (!) 146/92 122/86  Pulse: 87   Temp: 98.5 F (36.9 C)   TempSrc: Oral   SpO2: 97%   Weight: 115 lb 6.4 oz (52.3 kg)   Height: 5\' 3"  (1.6 m)    Body mass index is 20.44 kg/m.  Wt Readings from Last 3 Encounters:  01/16/19 115 lb 6.4 oz (52.3 kg)  12/30/18 111 lb (50.3 kg)  06/18/16 122 lb (55.3 kg)    Physical Exam Vitals signs and nursing note reviewed.  Constitutional:      Appearance: He is well-developed.  HENT:     Head: Normocephalic and atraumatic.  Eyes:     Conjunctiva/sclera: Conjunctivae normal.     Pupils: Pupils are equal, round, and reactive to light.  Neck:     Musculoskeletal: Neck supple.  Pulmonary:     Effort: Pulmonary effort is normal.  Skin:    General: Skin is warm and dry.  Neurological:     Mental Status: He is alert and oriented to person, place, and time.     No results found for this or any previous visit (from the past 24 hour(s)).  No results found.   ASSESSMENT and PLAN  1. Gastroesophageal reflux disease without esophagitis Most likely post viral. Covid neg. Better on PPI, changing to pantoprazole BID due to side effects with omeprazole  2. Constipation, unspecified constipation type Controlled on miralax  3. Acute nonintractable headache, unspecified headache type Improved. Cont with supportive measures  4. Language barrier Interpreter present  FMLA forms completed. Able to return to work 01/17/2019 has completed 14 day quarantine for onset of sx with negative covid test.    Other orders - pantoprazole (PROTONIX) 20 MG tablet; Take 1 tablet (20 mg total) by mouth 2 (two) times daily before a meal.  Return in about 4 weeks (around 02/13/2019).    Rutherford Guys, MD Primary Care at [place],  [address] Ph.  [phone] Fax [phone]

## 2019-01-17 ENCOUNTER — Telehealth: Payer: Self-pay

## 2019-01-17 NOTE — Telephone Encounter (Signed)
fmla forms put in pcp box for completion

## 2019-02-13 ENCOUNTER — Encounter: Payer: Self-pay | Admitting: Family Medicine

## 2019-02-13 ENCOUNTER — Ambulatory Visit (INDEPENDENT_AMBULATORY_CARE_PROVIDER_SITE_OTHER): Payer: BC Managed Care – PPO | Admitting: Family Medicine

## 2019-02-13 ENCOUNTER — Other Ambulatory Visit: Payer: Self-pay

## 2019-02-13 VITALS — BP 130/90 | HR 86 | Temp 98.6°F | Ht 63.0 in | Wt 120.4 lb

## 2019-02-13 DIAGNOSIS — Z23 Encounter for immunization: Secondary | ICD-10-CM

## 2019-02-13 DIAGNOSIS — K219 Gastro-esophageal reflux disease without esophagitis: Secondary | ICD-10-CM

## 2019-02-13 MED ORDER — PANTOPRAZOLE SODIUM 20 MG PO TBEC: 20.0000 mg | DELAYED_RELEASE_TABLET | Freq: Two times a day (BID) | ORAL | 2 refills | Status: DC

## 2019-02-13 NOTE — Progress Notes (Signed)
12/7/202011:19 AM  Victor Munoz 1955-08-18, 63 y.o., male [id number]  Chief Complaint  Patient presents with  . Follow-up    says he is still having pain but it is getting better, no constipation    HPI:   Patient is a 63 y.o. male  who presents today for GERD followup  Interpreter present Last OV a month ago Changed to pantoprazole BID Doing much better GERD controlled Back to eating normal diet Headaches resolved Requesting flu vaccine today Has no acute concerns today  Depression screen [facility] 2/9 02/13/2019 01/16/2019 12/30/2018  Decreased Interest 0 0 0  Down, Depressed, Hopeless - 0 0  PHQ - 2 Score 0 0 0    Fall Risk  02/13/2019 01/16/2019 12/30/2018 04/15/2016  Falls in the past year? 0 0 0 No  Number falls in past yr: 0 0 0 -  Injury with Fall? 0 0 0 -  Follow up - Falls evaluation completed - -     Allergies  Allergen Reactions  . Tylenol [Acetaminophen]     Dizziness    Prior to Admission medications   Medication Sig Start Date End Date Taking? Authorizing Provider  pantoprazole (PROTONIX) 20 MG tablet Take 1 tablet (20 mg total) by mouth 2 (two) times daily before a meal. 01/16/19  Yes Rutherford Guys, MD  polyethylene glycol powder (GLYCOLAX/MIRALAX) 17 GM/SCOOP powder Take 17 g by mouth daily. 12/30/18  Yes Rutherford Guys, MD    Past Medical History:  Diagnosis Date  . Allergy    seasonal  . Arthritis   . GERD (gastroesophageal reflux disease)   . Night sweats 05/08/2014  . Sleep apnea    not diagnosed, but patient wakes up int he middle of the night SOB    No past surgical history on file.  Social History   Tobacco Use  . Smoking status: Never Smoker  . Smokeless tobacco: Never Used  Substance Use Topics  . Alcohol use: No    Family History  Problem Relation Age of Onset  . Colon cancer Neg Hx     ROS Per hpi  OBJECTIVE:  Today's Vitals   02/13/19 1116 02/13/19 1145  BP: (!) 166/99 130/90  Pulse: 86   Temp: 98.6 F (37 C)    SpO2: 98%   Weight: 120 lb 6.4 oz (54.6 kg)   Height: 5\' 3"  (1.6 m)    Body mass index is 21.33 kg/m.  BP Readings from Last 3 Encounters:  02/13/19 (!) 166/99  01/16/19 122/86  12/30/18 140/90    Physical Exam Vitals signs and nursing note reviewed.  Constitutional:      Appearance: He is well-developed.  HENT:     Head: Normocephalic and atraumatic.  Eyes:     Conjunctiva/sclera: Conjunctivae normal.     Pupils: Pupils are equal, round, and reactive to light.  Neck:     Musculoskeletal: Neck supple.  Cardiovascular:     Rate and Rhythm: Normal rate and regular rhythm.     Heart sounds: No murmur. No friction rub. No gallop.   Pulmonary:     Effort: Pulmonary effort is normal.     Breath sounds: Normal breath sounds. No wheezing or rales.  Skin:    General: Skin is warm and dry.  Neurological:     Mental Status: He is alert and oriented to person, place, and time.     No results found for this or any previous visit (from the past 24 hour(s)).  No results  found.   ASSESSMENT and PLAN  1. Gastroesophageal reflux disease without esophagitis Controlled. Continue current regime.  - repeat BP  2. Need for immunization against influenza  Other orders - Influenza (Seasonal) - pantoprazole (PROTONIX) 20 MG tablet; Take 1 tablet (20 mg total) by mouth 2 (two) times daily before a meal.  No follow-ups on file.    Rutherford Guys, MD Primary Care at [place], [place] [address] Ph.  [phone] Fax [phone]

## 2019-02-13 NOTE — Patient Instructions (Signed)
° ° ° °  If you have lab work done today you will be contacted with your lab results within the next 2 weeks.  If you have not heard from us then please contact us. The fastest way to get your results is to register for My Chart. ° ° °IF you received an x-ray today, you will receive an invoice from [facility]. Please contact [facility] at [phone] with questions or concerns regarding your invoice.  ° °IF you received labwork today, you will receive an invoice from [facility]. Please contact [facility] at [phone] with questions or concerns regarding your invoice.  ° °Our billing staff will not be able to assist you with questions regarding bills from these companies. ° °You will be contacted with the lab results as soon as they are available. The fastest way to get your results is to activate your My Chart account. Instructions are located on the last page of this paperwork. If you have not heard from us regarding the results in 2 weeks, please contact this office. °  ° ° ° °

## 2019-02-24 ENCOUNTER — Telehealth (INDEPENDENT_AMBULATORY_CARE_PROVIDER_SITE_OTHER): Payer: BC Managed Care – PPO | Admitting: Family Medicine

## 2019-02-24 ENCOUNTER — Other Ambulatory Visit: Payer: Self-pay

## 2019-02-24 DIAGNOSIS — Z789 Other specified health status: Secondary | ICD-10-CM | POA: Diagnosis not present

## 2019-02-24 DIAGNOSIS — K219 Gastro-esophageal reflux disease without esophagitis: Secondary | ICD-10-CM | POA: Diagnosis not present

## 2019-02-24 MED ORDER — POLYETHYLENE GLYCOL 3350 17 GM/SCOOP PO POWD: 17.0000 g | Freq: Every day | ORAL | 1 refills | Status: AC

## 2019-02-24 MED ORDER — PANTOPRAZOLE SODIUM 20 MG PO TBEC: 20.0000 mg | DELAYED_RELEASE_TABLET | Freq: Two times a day (BID) | ORAL | 2 refills | Status: AC

## 2019-02-24 NOTE — Progress Notes (Signed)
CC-headache and vomiting- Patient stated he only get a headache when he start vomiting. Patient seems to think this is the cause of him running out of his meds. Stomach hurts whenever he eats and throw up.

## 2019-02-24 NOTE — Progress Notes (Signed)
   Virtual Visit Note  I connected with patient on 02/24/19 at 345pm pm by phone and verified that I am speaking with the correct person using two identifiers. Victor Munoz is currently located at home and patient/newphew is currently with them during visit. The provider, Rutherford Guys, MD is located in their office at time of visit.  I discussed the limitations, risks, security and privacy concerns of performing an evaluation and management service by telephone and the availability of in person appointments. I also discussed with the patient that there may be a patient responsible charge related to this service. The patient expressed understanding and agreed to proceed.   CC: stomach pain  HPI ? Having return of epigastric pain Ran out omeprazole 2 weeks ago Pain after eating, having nausea with reflux/vomiting Denies any blood or black tarry stools Has never had EGD, reports remote history of ulcers  Nephew serves as interpreter    Allergies  Allergen Reactions  . Tylenol [Acetaminophen]     Dizziness    Prior to Admission medications   Medication Sig Start Date End Date Taking? Authorizing Provider  pantoprazole (PROTONIX) 20 MG tablet Take 1 tablet (20 mg total) by mouth 2 (two) times daily before a meal. 02/24/19   Rutherford Guys, MD  polyethylene glycol powder (GLYCOLAX/MIRALAX) 17 GM/SCOOP powder Take 17 g by mouth daily. 02/24/19   Rutherford Guys, MD    Past Medical History:  Diagnosis Date  . Allergy    seasonal  . Arthritis   . GERD (gastroesophageal reflux disease)   . Night sweats 05/08/2014  . Sleep apnea    not diagnosed, but patient wakes up int he middle of the night SOB    No past surgical history on file.  Social History   Tobacco Use  . Smoking status: Never Smoker  . Smokeless tobacco: Never Used  Substance Use Topics  . Alcohol use: No    Family History  Problem Relation Age of Onset  . Colon cancer Neg Hx     ROS Per hpi    Objective  Vitals as reported by the patient: none   ASSESSMENT and PLAN  1. Gastroesophageal reflux disease without esophagitis Refilled omeprazole. Referral to GI given severity of sx and h/o ulcer. RTC precautions reviewed - Ambulatory referral to Gastroenterology  2. Language barrier - Ambulatory referral to Gastroenterology  Other orders - pantoprazole (PROTONIX) 20 MG tablet; Take 1 tablet (20 mg total) by mouth 2 (two) times daily before a meal. - polyethylene glycol powder (GLYCOLAX/MIRALAX) 17 GM/SCOOP powder; Take 17 g by mouth daily.  FOLLOW-UP: prn   The above assessment and management plan was discussed with the patient. The patient verbalized understanding of and has agreed to the management plan. Patient is aware to call the clinic if symptoms persist or worsen. Patient is aware when to return to the clinic for a follow-up visit. Patient educated on when it is appropriate to go to the emergency department.    I provided 6 minutes of non-face-to-face time during this encounter.  Rutherford Guys, MD Primary Care at [place], [place] [address] Ph.  [phone] Fax [phone]

## 2019-04-17 ENCOUNTER — Encounter: Payer: Self-pay | Admitting: Family Medicine

## 2019-07-10 NOTE — Telephone Encounter (Signed)
No action required.

## 2020-07-20 DIAGNOSIS — Z20822 Contact with and (suspected) exposure to covid-19: Secondary | ICD-10-CM | POA: Diagnosis not present

## 2020-08-08 IMAGING — DX DG ABDOMEN 2V
2 series · 2 of 2 positions shown · non-contrast
Comparison: 07/08/2016 CT

CLINICAL DATA: Constipation and nausea.

EXAM:
ABDOMEN - 2 VIEW

[abdomen erect]
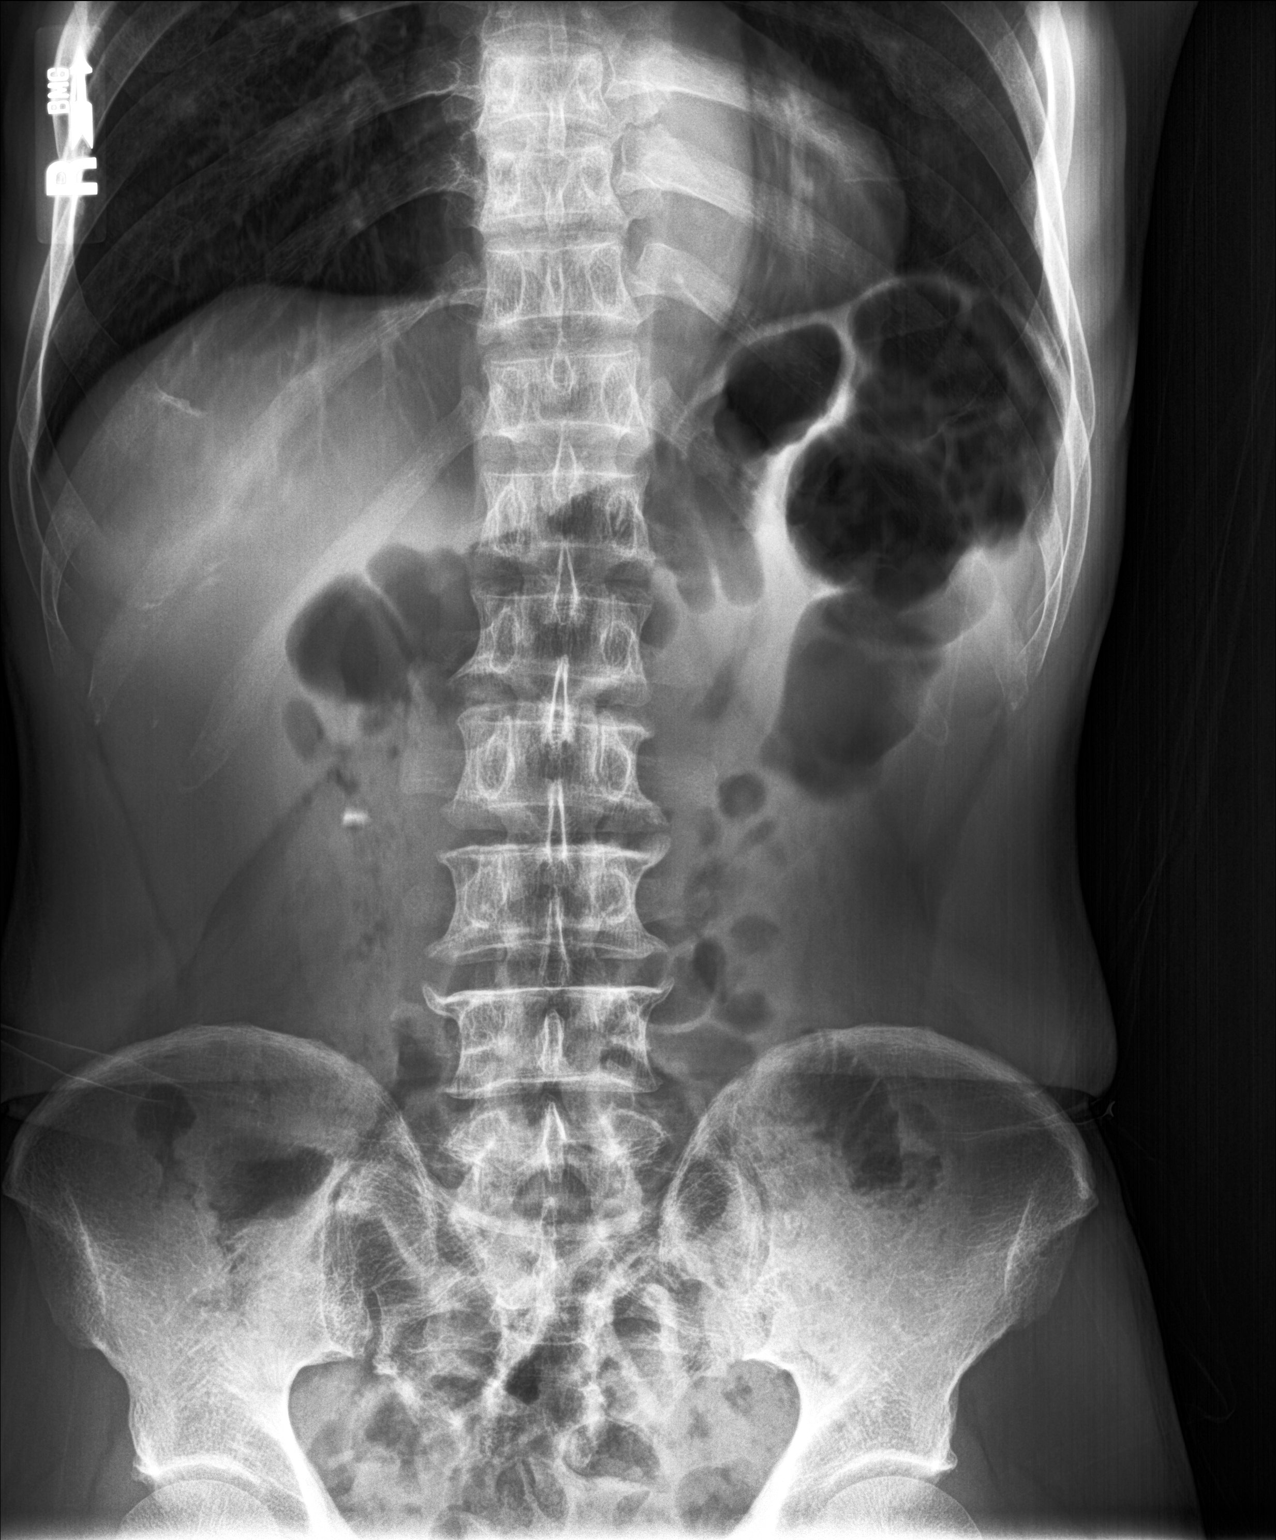

[abdomen supine]
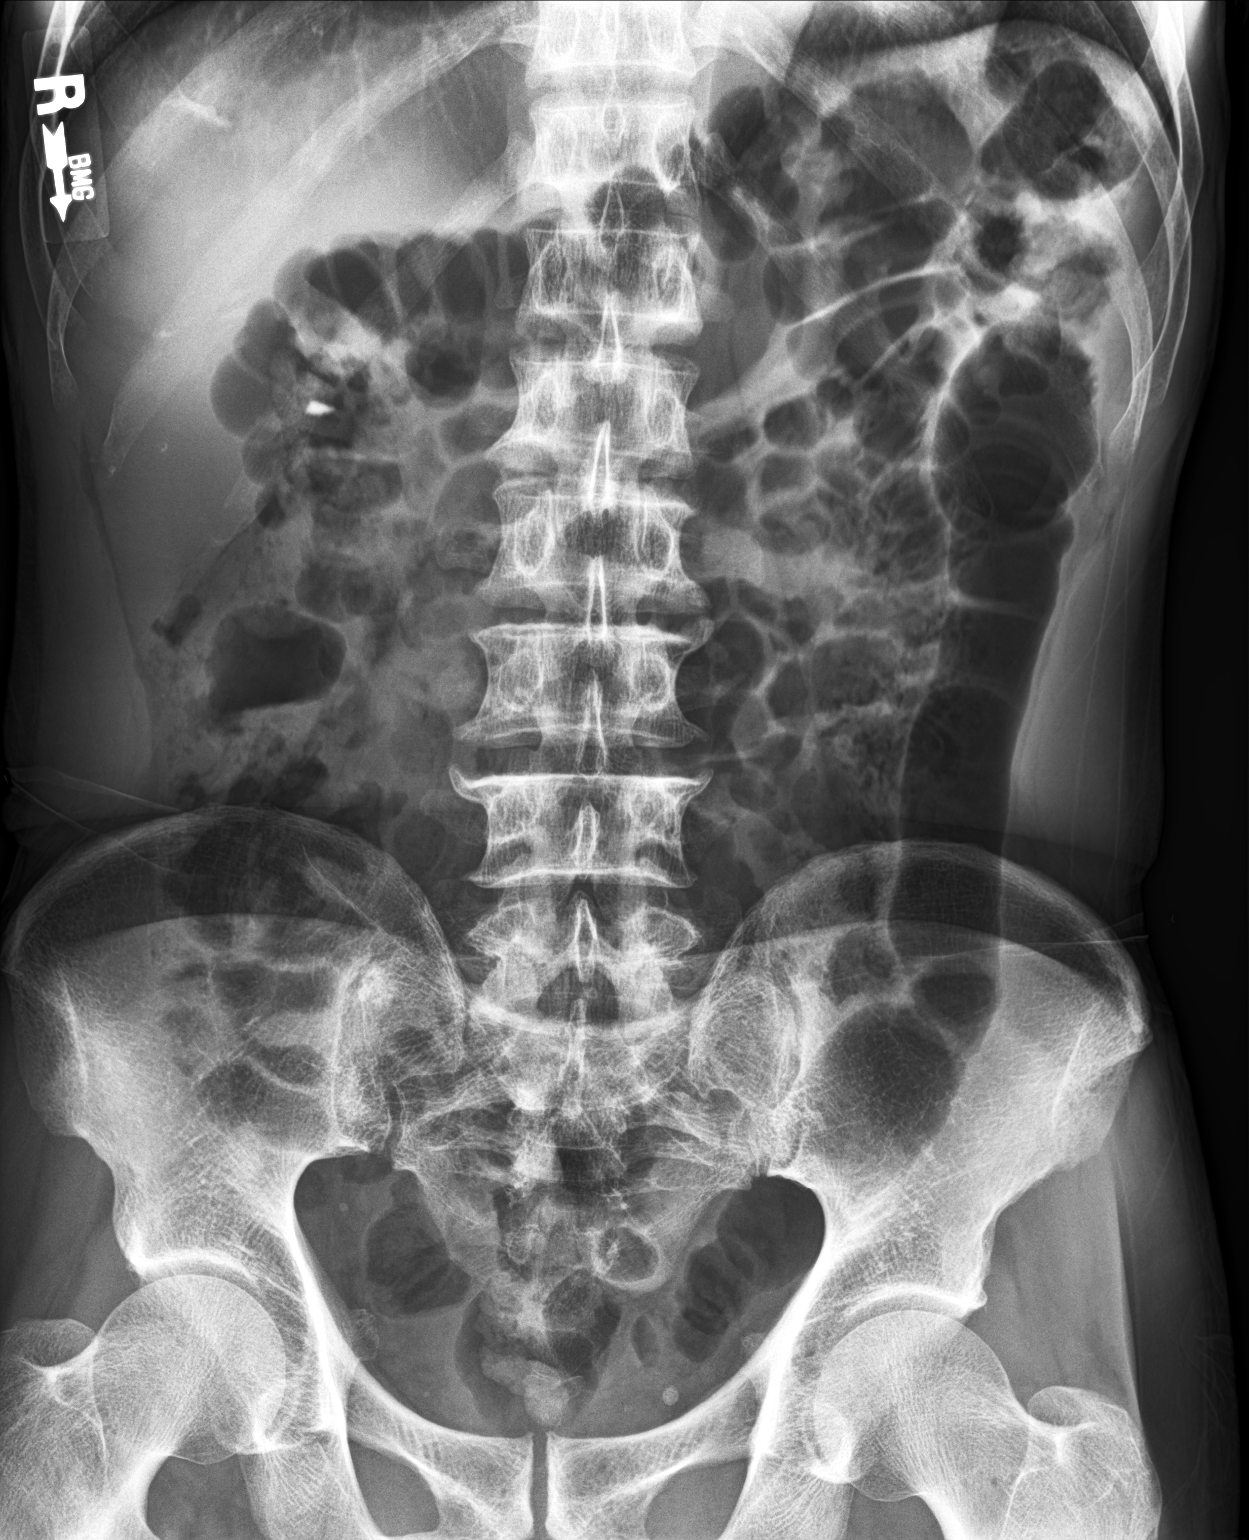

[2 of 2 positions shown; findings below may reference images not displayed]

FINDINGS: Nondistended gas-filled colon and small bowel noted.

A small to moderate amount of stool within colon noted.

No dilated bowel loops or pneumoperitoneum.

No suspicious calcifications are identified.

No acute or suspicious bony abnormalities are present. Degenerative
disc disease in the lumbar spine again noted.
IMPRESSION: Nonspecific nonobstructive bowel gas pattern. Small to moderate
amount of colonic stool.

## 2021-08-25 DIAGNOSIS — M549 Dorsalgia, unspecified: Secondary | ICD-10-CM | POA: Diagnosis not present

## 2021-08-25 DIAGNOSIS — Z0130 Encounter for examination of blood pressure without abnormal findings: Secondary | ICD-10-CM | POA: Diagnosis not present

## 2021-08-25 DIAGNOSIS — Z6821 Body mass index (BMI) 21.0-21.9, adult: Secondary | ICD-10-CM | POA: Diagnosis not present

## 2021-08-25 DIAGNOSIS — M79604 Pain in right leg: Secondary | ICD-10-CM | POA: Diagnosis not present

## 2021-08-25 DIAGNOSIS — M25551 Pain in right hip: Secondary | ICD-10-CM | POA: Diagnosis not present

## 2022-07-02 ENCOUNTER — Encounter: Payer: Self-pay | Admitting: Internal Medicine

## 2022-07-02 ENCOUNTER — Ambulatory Visit
Admission: EM | Admit: 2022-07-02 | Discharge: 2022-07-02 | Disposition: A | Discharge status: Home / Self Care | Payer: BC Managed Care – PPO | Attending: Internal Medicine | Admitting: Internal Medicine

## 2022-07-02 ENCOUNTER — Other Ambulatory Visit: Payer: Self-pay

## 2022-07-02 ENCOUNTER — Ambulatory Visit (INDEPENDENT_AMBULATORY_CARE_PROVIDER_SITE_OTHER): Payer: BC Managed Care – PPO

## 2022-07-02 ENCOUNTER — Other Ambulatory Visit: Payer: BLUE CROSS/BLUE SHIELD

## 2022-07-02 DIAGNOSIS — S91032A Puncture wound without foreign body, left ankle, initial encounter: Secondary | ICD-10-CM

## 2022-07-02 DIAGNOSIS — M25572 Pain in left ankle and joints of left foot: Secondary | ICD-10-CM

## 2022-07-02 DIAGNOSIS — Z23 Encounter for immunization: Secondary | ICD-10-CM

## 2022-07-02 DIAGNOSIS — S91012A Laceration without foreign body, left ankle, initial encounter: Secondary | ICD-10-CM | POA: Diagnosis not present

## 2022-07-02 MED ORDER — CIPROFLOXACIN HCL 500 MG PO TABS: 500.0000 mg | ORAL_TABLET | Freq: Two times a day (BID) | ORAL | 0 refills | Status: AC

## 2022-07-02 MED ORDER — DOXYCYCLINE HYCLATE 100 MG PO CAPS: 100.0000 mg | ORAL_CAPSULE | Freq: Two times a day (BID) | ORAL | 0 refills | Status: AC

## 2022-07-02 MED ORDER — TETANUS-DIPHTH-ACELL PERTUSSIS 5-2.5-18.5 LF-MCG/0.5 IM SUSY
0.5000 mL | PREFILLED_SYRINGE | Freq: Once | INTRAMUSCULAR | Status: AC
  Administered 2022-07-02: 0.5 mL via INTRAMUSCULAR

## 2022-07-02 NOTE — ED Provider Notes (Signed)
[facility]  Note:  This document was prepared using Dragon voice recognition software and may include unintentional dictation errors.  MRN: [id number] DOB: 09-04-1955 DATE: 07/02/22   Subjective:  Chief Complaint:  Chief Complaint  Patient presents with  . Puncture Wound  . Laceration    HPI: Victor Munoz is a 67 y.o. male presenting for a puncture wound and laceration to his left ankle 1 hour ago.  Interpreter was used for this encounter due to language barrier.  Patient was working on a church staple earlier today and was using a sawzall at the time.  Patient states the blade slipped and he punctured his ankle with the tip of the blade and cut his ankle with the actual take the blade.  They are able to control the bleeding at the worksite with applied pressure.  Patient reports pain with palpation and walking.  He states he is never gotten a tetanus shot. Denies fever, nausea/vomiting, numbness/tingling. Endorses pain and minimal bleeding. Presents NAD.  Prior to Admission medications   Medication Sig Start Date End Date Taking? Authorizing Provider  pantoprazole (PROTONIX) 20 MG tablet Take 1 tablet (20 mg total) by mouth 2 (two) times daily before a meal. 02/24/19   Lezlie Lye, Meda Coffee, MD  polyethylene glycol powder (GLYCOLAX/MIRALAX) 17 GM/SCOOP powder Take 17 g by mouth daily. 02/24/19   Noni Saupe, MD     Allergies  Allergen Reactions  . Tylenol [Acetaminophen]     Dizziness    History:   Past Medical History:  Diagnosis Date  . Allergy    seasonal  . Arthritis   . GERD (gastroesophageal reflux disease)   . Night sweats 05/08/2014  . Sleep apnea    not diagnosed, but patient wakes up int he middle of the night SOB     History reviewed. No pertinent surgical history.  Family History  Problem Relation Age of Onset  . Colon cancer Neg Hx     Social History   Tobacco Use  . Smoking status: Never  . Smokeless tobacco: Never  Vaping Use  .  Vaping Use: Never used  Substance Use Topics  . Alcohol use: No  . Drug use: No    Review of Systems  Constitutional:  Negative for fever.  Cardiovascular:  Negative for leg swelling.  Gastrointestinal:  Negative for nausea and vomiting.  Musculoskeletal:  Positive for arthralgias.  Skin:  Positive for wound.  Neurological:  Negative for numbness.     Objective:   Vitals: BP (!) 141/92 (BP Location: Right Arm)   Pulse 94   Temp 99.2 F (37.3 C) (Oral)   Resp 18   SpO2 95%   Physical Exam Constitutional:      General: He is not in acute distress.    Appearance: Normal appearance. He is well-developed and normal weight. He is not ill-appearing or toxic-appearing.     Comments: Patient presented to exam in a wheelchair.  HENT:     Head: Normocephalic and atraumatic.  Cardiovascular:     Rate and Rhythm: Normal rate and regular rhythm.     Pulses:          Dorsalis pedis pulses are 2+ on the left side.     Heart sounds: Normal heart sounds.  Pulmonary:     Effort: Pulmonary effort is normal.     Breath sounds: Normal breath sounds.     Comments: Clear to auscultation bilaterally  Abdominal:     General: Bowel  sounds are normal.     Palpations: Abdomen is soft.     Tenderness: There is no abdominal tenderness.  Musculoskeletal:     Right lower leg: No edema.     Left lower leg: No edema.     Left ankle: Laceration present. Tenderness present. Decreased range of motion.     Comments: Laceration to left ankle medially as well as puncture wound to left ankle medially. Tenderness to palpation overlying laceration and puncture wound.  Decreased range of motion due to pain with walking and weightbearing to left ankle.  Neurovascular intact.  No warmth, erythema, discharge.  Minimal bleeding at time of exam.  Feet:     Left foot:     Toenail Condition: Left toenails are abnormally thick.  Skin:    General: Skin is warm and dry.     Findings: Laceration present.      Comments: Patient has approximately 1 cm laceration to left ankle medially.  Minimal bleeding noted at time of exam.  Neurovascularly intact.  No warmth, erythema or discharge.  Patient also has puncture wound to left ankle medially just anteriorly to the laceration.  Neurological:     General: No focal deficit present.     Mental Status: He is alert.  Psychiatric:        Mood and Affect: Mood and affect normal.    Results:  Labs: No results found for this or any previous visit (from the past 24 hour(s)).  Radiology: DG Ankle Complete Left  Result Date: 07/02/2022 CLINICAL DATA:  Left ankle pain EXAM: LEFT ANKLE COMPLETE - 3+ VIEW COMPARISON:  None Available. FINDINGS: There is no evidence of fracture, dislocation, or joint effusion. There is no evidence of arthropathy or other focal bone abnormality. Soft tissues are unremarkable. IMPRESSION: Negative. Electronically Signed   By: Charlett Nose M.D.   On: 07/02/2022 17:40     UC Course/Treatments:  Procedures: Laceration Repair  Date/Time: 07/02/2022 6:20 PM  Performed by: Cynda Acres, PA-C Authorized by: Merrilee Jansky, MD   Consent:    Consent obtained:  Verbal   Consent given by:  Patient   Risks discussed:  Pain   Alternatives discussed:  No treatment Universal protocol:    Procedure explained and questions answered to patient or proxy's satisfaction: yes     Imaging studies available: yes   Anesthesia:    Anesthesia method:  Local infiltration   Local anesthetic:  Lidocaine 1% w/o epi Laceration details:    Location:  Foot   Foot location:  L ankle   Length (cm):  1 Pre-procedure details:    Preparation:  Imaging obtained to evaluate for foreign bodies Exploration:    Hemostasis achieved with:  Direct pressure   Imaging obtained: x-ray     Imaging outcome: foreign body not noted     Wound exploration: entire depth of wound visualized   Treatment:    Area cleansed with:  Povidone-iodine   Amount of  cleaning:  Standard Skin repair:    Repair method:  Sutures   Suture size:  4-0   Suture material:  Nylon   Suture technique:  Simple interrupted   Number of sutures:  2 Approximation:    Approximation:  Close Repair type:    Repair type:  Simple Post-procedure details:    Dressing:  Non-adherent dressing   Procedure completion:  Tolerated well, no immediate complications    Medications Ordered in UC: Medications  Tdap (BOOSTRIX) injection 0.5 mL (0.5 mLs  Intramuscular Given 07/02/22 1756)     Assessment and Plan :     ICD-10-CM   1. Laceration of left ankle, initial encounter  S91.012A     2. Puncture wound of left ankle, initial encounter  S91.032A     3. Acute left ankle pain  M25.572      Laceration of left ankle: Afebrile, nontoxic-appearing, NAD. VSS. DDX includes but not limited to: Laceration, abrasion, skin tear, contusion Patient presented with laceration and puncture wound.  Imaging was unremarkable.  At the time of exam, very minimal bleeding.  Wound was cleansed with iodine and local anesthesia achieved.  2 simple sutures were placed.  Dressing was applied.  Tetanus was updated.  Doxycycline 100 mg twice daily as well as Ciprofloxacin 500 mg twice daily was prescribed to prophylactically prevent infection.  Wound care instructions provided.  Patient instructed to follow-up for removal in 12 to 14 days.  Strict ED precautions were given and patient verbalized understanding.  Puncture wound of left ankle: Afebrile, nontoxic-appearing, NAD. VSS. DDX includes but not limited to: Laceration, abrasion, skin tear, contusion Patient presented with laceration and puncture wound.  Imaging was unremarkable.  At the time of exam, very minimal bleeding.  Laceration was repaired, but puncture wound was left open.  Tetanus was updated. Doxycycline 100 mg twice daily as well as Ciprofloxacin 500 mg twice daily was prescribed to prophylactically prevent infection.  Wound care  instructions provided.  Strict ED precautions were given and patient verbalized understanding.  Acute left ankle pain: Secondary to puncture wound or laceration See notes above   ED Discharge Orders          Ordered    doxycycline (VIBRAMYCIN) 100 MG capsule  2 times daily        07/02/22 1808    ciprofloxacin (CIPRO) 500 MG tablet  2 times daily        07/02/22 1808             PDMP not reviewed this encounter. '    Cynda Acres, PA-C 07/02/22 1827

## 2022-07-02 NOTE — ED Notes (Signed)
Informed translator and co-worker that patient needs to follow up with workers comp and stitches need to come out in 12-14 days.  Reviewed signs of infection and instructed to get seen if occurred. Informed all that medicine at pharmacy and where located

## 2022-07-02 NOTE — ED Triage Notes (Signed)
Patient has a laceration to the inside of left ankle.  Bleeding is controlled.  It is suspected that the saw tooth also punctured into ankle.

## 2022-07-02 NOTE — Discharge Instructions (Signed)
Keep your wound clean and as dry as possible. Do not immerse or soak your wound in water. This means no swimming, washing dishes (unless thick rubber gloves are used), baths, or hot tubs until the stitches are removed. Leave original bandages on for the first 24 hours. After this time, showering or rinsing is recommended, rather than bathing. After the first 24 hours, remove old bandages and gently cleanse your wound with soap and water. Cleansing twice a day prevents buildup of debris and will result in easier suture removal. Return in 12-14 days for suture removal.   I have sent two antibiotics (Doxycycline and Ciprofloxacin) for you to take by mouth. These are often used to treat skin infections. Take them as directed.   Return in 2 to 3 days if no improvement. It is very important for you to pay attention to any new symptoms or worsening of your current condition. Please go directly to the Emergency Department immediately should you begin to feel worse in any way or have any of the following symptoms: persistent fevers, increased pain, increased swelling, increased redness, redness outside the demarcation or streaking up your leg.

## 2022-07-20 ENCOUNTER — Ambulatory Visit (HOSPITAL_COMMUNITY)
Admission: RE | Admit: 2022-07-20 | Discharge: 2022-07-20 | Disposition: A | Discharge status: Home / Self Care | Payer: BC Managed Care – PPO | Source: Ambulatory Visit

## 2022-07-20 ENCOUNTER — Encounter (HOSPITAL_COMMUNITY): Payer: Self-pay

## 2022-07-20 DIAGNOSIS — Z4802 Encounter for removal of sutures: Secondary | ICD-10-CM | POA: Diagnosis not present

## 2022-07-20 NOTE — ED Notes (Signed)
Pt is here for suture removal. Remove 2 suture from L-foot.

## 2022-07-20 NOTE — ED Triage Notes (Signed)
Here for suture removal on L-foot.
